# Patient Record
Sex: Male | Born: 1940 | Race: White | Hispanic: No | Marital: Married | Smoking: Never smoker
Health system: Southern US, Community
[De-identification: ages and names within clinical notes are randomized; demographics above are authoritative.]

## PROBLEM LIST (undated history)

## (undated) DIAGNOSIS — I2510 Atherosclerotic heart disease of native coronary artery without angina pectoris: Secondary | ICD-10-CM

## (undated) DIAGNOSIS — N400 Enlarged prostate without lower urinary tract symptoms: Secondary | ICD-10-CM

## (undated) DIAGNOSIS — N289 Disorder of kidney and ureter, unspecified: Secondary | ICD-10-CM

## (undated) DIAGNOSIS — I10 Essential (primary) hypertension: Secondary | ICD-10-CM

## (undated) DIAGNOSIS — E079 Disorder of thyroid, unspecified: Secondary | ICD-10-CM

## (undated) HISTORY — PX: OTHER SURGICAL HISTORY: SHX169

## (undated) HISTORY — PX: CHOLECYSTECTOMY: SHX55

---

## 2014-07-16 DIAGNOSIS — I2510 Atherosclerotic heart disease of native coronary artery without angina pectoris: Secondary | ICD-10-CM | POA: Insufficient documentation

## 2014-07-16 DIAGNOSIS — E039 Hypothyroidism, unspecified: Secondary | ICD-10-CM | POA: Insufficient documentation

## 2015-01-11 DIAGNOSIS — N401 Enlarged prostate with lower urinary tract symptoms: Secondary | ICD-10-CM | POA: Insufficient documentation

## 2015-06-03 DIAGNOSIS — E785 Hyperlipidemia, unspecified: Secondary | ICD-10-CM | POA: Insufficient documentation

## 2015-12-16 DIAGNOSIS — I679 Cerebrovascular disease, unspecified: Secondary | ICD-10-CM | POA: Insufficient documentation

## 2017-02-21 ENCOUNTER — Emergency Department: Payer: Medicare Other

## 2017-02-21 ENCOUNTER — Emergency Department
Admission: EM | Admit: 2017-02-21 | Discharge: 2017-02-21 | Disposition: A | Discharge status: Home / Self Care | Payer: Medicare Other | Attending: Emergency Medicine | Admitting: Emergency Medicine

## 2017-02-21 ENCOUNTER — Other Ambulatory Visit: Payer: Self-pay

## 2017-02-21 ENCOUNTER — Encounter: Payer: Self-pay | Admitting: Emergency Medicine

## 2017-02-21 DIAGNOSIS — R319 Hematuria, unspecified: Secondary | ICD-10-CM | POA: Diagnosis present

## 2017-02-21 DIAGNOSIS — N390 Urinary tract infection, site not specified: Secondary | ICD-10-CM | POA: Insufficient documentation

## 2017-02-21 DIAGNOSIS — I10 Essential (primary) hypertension: Secondary | ICD-10-CM | POA: Diagnosis not present

## 2017-02-21 DIAGNOSIS — I2510 Atherosclerotic heart disease of native coronary artery without angina pectoris: Secondary | ICD-10-CM | POA: Diagnosis not present

## 2017-02-21 HISTORY — DX: Disorder of kidney and ureter, unspecified: N28.9

## 2017-02-21 HISTORY — DX: Essential (primary) hypertension: I10

## 2017-02-21 HISTORY — DX: Disorder of thyroid, unspecified: E07.9

## 2017-02-21 HISTORY — DX: Benign prostatic hyperplasia without lower urinary tract symptoms: N40.0

## 2017-02-21 HISTORY — DX: Atherosclerotic heart disease of native coronary artery without angina pectoris: I25.10

## 2017-02-21 LAB — CBC WITH DIFFERENTIAL/PLATELET
Basophils Absolute: 0 10*3/uL (ref 0–0.1)
Basophils Relative: 0 %
EOS ABS: 0.2 10*3/uL (ref 0–0.7)
Eosinophils Relative: 2 %
HCT: 39.5 % — ABNORMAL LOW (ref 40.0–52.0)
HEMOGLOBIN: 13.6 g/dL (ref 13.0–18.0)
LYMPHS ABS: 0.7 10*3/uL — AB (ref 1.0–3.6)
LYMPHS PCT: 7 %
MCH: 33.7 pg (ref 26.0–34.0)
MCHC: 34.3 g/dL (ref 32.0–36.0)
MCV: 98.4 fL (ref 80.0–100.0)
Monocytes Absolute: 0.9 10*3/uL (ref 0.2–1.0)
Monocytes Relative: 9 %
NEUTROS ABS: 8.2 10*3/uL — AB (ref 1.4–6.5)
NEUTROS PCT: 82 %
Platelets: 211 10*3/uL (ref 150–440)
RBC: 4.02 MIL/uL — AB (ref 4.40–5.90)
RDW: 14.1 % (ref 11.5–14.5)
WBC: 10.1 10*3/uL (ref 3.8–10.6)

## 2017-02-21 LAB — URINALYSIS, COMPLETE (UACMP) WITH MICROSCOPIC
Bilirubin Urine: NEGATIVE
Glucose, UA: NEGATIVE mg/dL
Ketones, ur: NEGATIVE mg/dL
NITRITE: POSITIVE — AB
PH: 5 (ref 5.0–8.0)
Protein, ur: 30 mg/dL — AB
SPECIFIC GRAVITY, URINE: 1.018 (ref 1.005–1.030)
Squamous Epithelial / HPF: NONE SEEN

## 2017-02-21 LAB — BASIC METABOLIC PANEL
Anion gap: 7 (ref 5–15)
BUN: 23 mg/dL — AB (ref 6–20)
CHLORIDE: 103 mmol/L (ref 101–111)
CO2: 27 mmol/L (ref 22–32)
Calcium: 9.1 mg/dL (ref 8.9–10.3)
Creatinine, Ser: 1.3 mg/dL — ABNORMAL HIGH (ref 0.61–1.24)
GFR calc Af Amer: 59 mL/min — ABNORMAL LOW (ref >60)
GFR calc non Af Amer: 51 mL/min — ABNORMAL LOW (ref >60)
Glucose, Bld: 105 mg/dL — ABNORMAL HIGH (ref 65–99)
POTASSIUM: 3.9 mmol/L (ref 3.5–5.1)
SODIUM: 137 mmol/L (ref 135–145)

## 2017-02-21 MED ORDER — CEPHALEXIN 500 MG PO CAPS: 500.0000 mg | ORAL_CAPSULE | Freq: Three times a day (TID) | ORAL | 0 refills | Status: AC

## 2017-02-21 MED ORDER — CEPHALEXIN 500 MG PO CAPS
500.0000 mg | ORAL_CAPSULE | Freq: Once | ORAL | Status: AC
  Administered 2017-02-21: 500 mg via ORAL
  Filled 2017-02-21: qty 1

## 2017-02-21 NOTE — ED Triage Notes (Signed)
Pt presents to ED c/o back pain , intermittent bladder pain , and dysuria for a few days. Pt woke up this morning with blood in his urine, denies pain.

## 2017-02-21 NOTE — ED Provider Notes (Signed)
[facility] Emergency Department Provider Note  ____________________________________________   First MD Initiated Contact with Patient 02/21/17 1414     (approximate)  I have reviewed the triage vital signs and the nursing notes.   HISTORY  Chief Complaint Hematuria; Back Pain; and Nephrolithiasis    HPI Douglas Hale is a 76 y.o. male with a history of coronary artery disease as well as multiple kidney stones is present to the emergency department today with hematuria. He says that 3 days ago he was having diffuse body aches, low-grade temperature and back pain. However, he says this stopped all of a sudden and then last night he started having hematuria. He says that he has had 3 episodes of hematuria since 12 AM. Says he is very mild low back pain now which is consistent with his chronic low back pain but less than what he has usually. He says the back pain is the low lumbar region in the midline and also little bit to the right. He denies any radiation to the lower extremities.Also with urinary frequency recently.   Past Medical History:  Diagnosis Date  . Coronary artery disease   . Hypertension   . Prostate enlargement   . Renal disorder   . Thyroid disease     There are no active problems to display for this patient.   Past Surgical History:  Procedure Laterality Date  . CHOLECYSTECTOMY    . lithotripsy      Prior to Admission medications   Not on File    Allergies Hydrocodone and Sulfa antibiotics  History reviewed. No pertinent family history.  Social History Social History  Substance Use Topics  . Smoking status: Never Smoker  . Smokeless tobacco: Not on file  . Alcohol use No    Review of Systems Constitutional: No fever/chills Eyes: No visual changes. ENT: No sore throat. Cardiovascular: Denies chest pain. Respiratory: Denies shortness of breath. Gastrointestinal: No abdominal pain.  No nausea, no vomiting.  No  diarrhea.  No constipation. Genitourinary: as above Musculoskeletal:as above. Skin: Negative for rash. Neurological: Negative for headaches, focal weakness or numbness.  10-point ROS otherwise negative.  ____________________________________________   PHYSICAL EXAM:  VITAL SIGNS: ED Triage Vitals  Enc Vitals Group     BP 02/21/17 1125 92/73     Pulse Rate 02/21/17 1125 (!) 114     Resp 02/21/17 1125 19     Temp 02/21/17 1125 98.4 F (36.9 C)     Temp Source 02/21/17 1125 Oral     SpO2 02/21/17 1125 96 %     Weight 02/21/17 1128 195 lb (88.5 kg)     Height 02/21/17 1128  (1.753 m)     Head Circumference --      Peak Flow --      Pain Score --      Pain Loc --      Pain Edu? --      Excl. in GC? --     Constitutional: Alert and oriented. Well appearing and in no acute distress. Eyes: Conjunctivae are normal. PERRL. EOMI. Head: Atraumatic. Nose: No congestion/rhinnorhea. Mouth/Throat: Mucous membranes are moist.   Neck: No stridor.   Cardiovascular: Normal rate, regular rhythm. Grossly normal heart sounds.   Respiratory: Normal respiratory effort.  No retractions. Lungs CTAB. Gastrointestinal: Soft and nontender. No distention.  Musculoskeletal: No lower extremity tenderness nor edema.  No joint effusions.No tenderness over the lumbar region throughout. No deformity or step-off. Neurologic:  Normal speech  and language. No gross focal neurologic deficits are appreciated.  Skin:  Skin is warm, dry and intact. No rash noted. Psychiatric: Mood and affect are normal. Speech and behavior are normal.  ____________________________________________   LABS (all labs ordered are listed, but only abnormal results are displayed)  Labs Reviewed  URINALYSIS, COMPLETE (UACMP) WITH MICROSCOPIC - Abnormal; Notable for the following:       Result Value   Color, Urine AMBER (*)    APPearance CLOUDY (*)    Hgb urine dipstick LARGE (*)    Protein, ur 30 (*)    Nitrite POSITIVE  (*)    Leukocytes, UA LARGE (*)    Bacteria, UA MANY (*)    All other components within normal limits  CBC WITH DIFFERENTIAL/PLATELET - Abnormal; Notable for the following:    RBC 4.02 (*)    HCT 39.5 (*)    Neutro Abs 8.2 (*)    Lymphs Abs 0.7 (*)    All other components within normal limits  BASIC METABOLIC PANEL - Abnormal; Notable for the following:    Glucose, Bld 105 (*)    BUN 23 (*)    Creatinine, Ser 1.30 (*)    GFR calc non Af Amer 51 (*)    GFR calc Af Amer 59 (*)    All other components within normal limits  URINE CULTURE   ____________________________________________  EKG   ____________________________________________  RADIOLOGY    US Renal (Final result)  Result time 02/21/17 16:09:18  Final result by Elberta Fortis, MD (02/21/17 16:09:18)           Narrative:   CLINICAL DATA: Low back pain 3 days. Gross hematuria. History of kidney stones.  EXAM: RENAL / URINARY TRACT ULTRASOUND COMPLETE  COMPARISON: None.  FINDINGS: Right Kidney:  Length: 12.2 cm. Several shadowing echogenic foci compatible with stones with the largest measuring 9 mm over the lower pole. 2.2 cm cyst over the mid pole. No hydronephrosis. Echogenicity within normal.  Left Kidney:  Length: 10.9 cm. Echogenicity within normal limits. No mass or hydronephrosis visualized. Few tiny shadowing echogenic foci likely stones with the largest measuring 4 mm over the mid pole.  Bladder:  Appears normal for degree of bladder distention. Bilateral ureteral jets visualized.  Mild prominence of the prostate gland.  IMPRESSION: Normal size kidneys. Findings suggesting mild bilateral nephrolithiasis. No hydronephrosis.  Mild prostatic enlargement.   Electronically Signed By: Elberta Fortis M.D. On: 02/21/2017 16:09            ____________________________________________   PROCEDURES  Procedure(s) performed:   Procedures  Critical Care performed:    ____________________________________________   INITIAL IMPRESSION / ASSESSMENT AND PLAN / ED COURSE  Pertinent labs & imaging results that were available during my care of the patient were reviewed by me and considered in my medical decision making (see chart for details).  ----------------------------------------- 4:46 PM on 02/21/2017 -----------------------------------------  Patient resting comfortably at this time. Reassuring workup. Renal insufficiency. Unsure if this is new or chronic. No recent kidney functions on record. Patient will be discharged with Keflex. Explained the diagnosis was. The patient and family. He has a primary care doctor and he'll be following up there within 1 week.      ____________________________________________   FINAL CLINICAL IMPRESSION(S) / ED DIAGNOSES  UTI.    NEW MEDICATIONS STARTED DURING THIS VISIT:  New Prescriptions   No medications on file     Note:  This document was prepared using Dragon voice recognition software and  may include unintentional dictation errors.    Myrna Blazer, MD 02/21/17 [phone]

## 2017-02-23 LAB — URINE CULTURE

## 2017-02-26 ENCOUNTER — Emergency Department
Admission: EM | Admit: 2017-02-26 | Discharge: 2017-02-26 | Disposition: A | Discharge status: Home / Self Care | Payer: Medicare Other | Attending: Emergency Medicine | Admitting: Emergency Medicine

## 2017-02-26 ENCOUNTER — Emergency Department: Payer: Medicare Other

## 2017-02-26 ENCOUNTER — Encounter: Payer: Self-pay | Admitting: Emergency Medicine

## 2017-02-26 DIAGNOSIS — I10 Essential (primary) hypertension: Secondary | ICD-10-CM | POA: Insufficient documentation

## 2017-02-26 DIAGNOSIS — Y999 Unspecified external cause status: Secondary | ICD-10-CM | POA: Diagnosis not present

## 2017-02-26 DIAGNOSIS — Y929 Unspecified place or not applicable: Secondary | ICD-10-CM | POA: Diagnosis not present

## 2017-02-26 DIAGNOSIS — G8929 Other chronic pain: Secondary | ICD-10-CM

## 2017-02-26 DIAGNOSIS — W010XXA Fall on same level from slipping, tripping and stumbling without subsequent striking against object, initial encounter: Secondary | ICD-10-CM | POA: Diagnosis not present

## 2017-02-26 DIAGNOSIS — S3992XA Unspecified injury of lower back, initial encounter: Secondary | ICD-10-CM | POA: Diagnosis present

## 2017-02-26 DIAGNOSIS — I2510 Atherosclerotic heart disease of native coronary artery without angina pectoris: Secondary | ICD-10-CM | POA: Diagnosis not present

## 2017-02-26 DIAGNOSIS — Y939 Activity, unspecified: Secondary | ICD-10-CM | POA: Insufficient documentation

## 2017-02-26 DIAGNOSIS — S20222A Contusion of left back wall of thorax, initial encounter: Secondary | ICD-10-CM

## 2017-02-26 DIAGNOSIS — M545 Low back pain: Secondary | ICD-10-CM

## 2017-02-26 DIAGNOSIS — S300XXA Contusion of lower back and pelvis, initial encounter: Secondary | ICD-10-CM | POA: Diagnosis not present

## 2017-02-26 LAB — URINALYSIS, COMPLETE (UACMP) WITH MICROSCOPIC
Bacteria, UA: NONE SEEN
Bilirubin Urine: NEGATIVE
GLUCOSE, UA: NEGATIVE mg/dL
Hgb urine dipstick: NEGATIVE
KETONES UR: NEGATIVE mg/dL
Leukocytes, UA: NEGATIVE
Nitrite: NEGATIVE
PROTEIN: NEGATIVE mg/dL
Specific Gravity, Urine: 1.018 (ref 1.005–1.030)
pH: 6 (ref 5.0–8.0)

## 2017-02-26 MED ORDER — CARISOPRODOL 350 MG PO TABS: 350.0000 mg | ORAL_TABLET | Freq: Three times a day (TID) | ORAL | 0 refills | Status: DC

## 2017-02-26 MED ORDER — OXYCODONE-ACETAMINOPHEN 5-325 MG PO TABS
1.0000 | ORAL_TABLET | Freq: Once | ORAL | Status: AC
  Administered 2017-02-26: 1 via ORAL
  Filled 2017-02-26: qty 1

## 2017-02-26 MED ORDER — OXYCODONE-ACETAMINOPHEN 5-325 MG PO TABS: 1.0000 | ORAL_TABLET | Freq: Three times a day (TID) | ORAL | 0 refills | Status: DC | PRN

## 2017-02-26 NOTE — ED Triage Notes (Signed)
Pt to ed with c/o fall last night.  Pt states he was getting into the bed and fell backwards into nightstand. Pt now c/o back pain.

## 2017-02-26 NOTE — Discharge Instructions (Signed)
Your exam, labs, and x-ray are essentially normal today. There is no evidence of an acute (new) fracture to the spine. Take the prescription meds as directed. Follow-up with your provider as needed. Return to the ED for acutely worsening symptoms.

## 2017-02-26 NOTE — ED Provider Notes (Signed)
[facility] Emergency Department Provider Note ____________________________________________  Time seen: 1249  I have reviewed the triage vital signs and the nursing notes.  HISTORY  Chief Complaint  Fall  HPI Douglas Hale is a 76 y.o. male presents to the ED accompanied by his wife, for evaluation of back pain following a fall last night. He describes somehow slipping while leaning against the bed. He fell against the nightstand. He reports pain to the left thoracolumbar region. He has a hs He denies hematuria, laceration, sprain, head injury or LOC. He was seen in the ED 5 days prior for flank pain and suspected kidney stones.  Past Medical History:  Diagnosis Date  . Coronary artery disease   . Hypertension   . Prostate enlargement   . Renal disorder   . Thyroid disease     There are no active problems to display for this patient.   Past Surgical History:  Procedure Laterality Date  . CHOLECYSTECTOMY    . lithotripsy      Prior to Admission medications   Medication Sig Start Date End Date Taking? Authorizing Provider  carisoprodol (SOMA) 350 MG tablet Take 1 tablet (350 mg total) by mouth 3 (three) times daily. 02/26/17   Christino Mcglinchey V Bacon Brexton Sofia, PA-C  cephALEXin (KEFLEX) 500 MG capsule Take 1 capsule (500 mg total) by mouth 3 (three) times daily. 02/21/17 03/03/17  Myrna Blazer, MD  oxyCODONE-acetaminophen (ROXICET) 5-325 MG tablet Take 1 tablet by mouth every 8 (eight) hours as needed. 02/26/17   Ruba Outen V Bacon Angelyne Terwilliger, PA-C    Allergies Escitalopram; Hydrocodone; Lexapro [escitalopram oxalate]; and Sulfa antibiotics  History reviewed. No pertinent family history.  Social History Social History  Substance Use Topics  . Smoking status: Never Smoker  . Smokeless tobacco: Never Used  . Alcohol use No    Review of Systems  Constitutional: Negative for fever. Cardiovascular: Negative for chest pain. Respiratory: Negative for  shortness of breath. Gastrointestinal: Negative for abdominal pain, vomiting and diarrhea. Genitourinary: Negative for dysuria. Musculoskeletal: Positive for back pain. ____________________________________________  PHYSICAL EXAM:  VITAL SIGNS: ED Triage Vitals  Enc Vitals Group     BP 02/26/17 1157 117/79     Pulse Rate 02/26/17 1157 (!) 108     Resp 02/26/17 1157 18     Temp 02/26/17 1157 98.4 F (36.9 C)     Temp Source 02/26/17 1157 Oral     SpO2 02/26/17 1157 97 %     Weight 02/26/17 1158 195 lb (88.5 kg)     Height --      Head Circumference --      Peak Flow --      Pain Score 02/26/17 1157 10     Pain Loc --      Pain Edu? --      Excl. in GC? --    Constitutional: Alert and oriented. Well appearing and in no distress. Head: Normocephalic and atraumatic. Eyes: Conjunctivae are normal. PERRL. Normal extraocular movements Cardiovascular: Normal rate, regular rhythm. Normal distal pulses. Respiratory: Normal respiratory effort. No wheezes/rales/rhonchi. Gastrointestinal: Soft and nontender. No distention. Musculoskeletal: Normal spinal alignment without midline tenderness, spasm, deformity, or step-off. Minimally tender to palp over the left thoracolumbar region. No flank tenderness. Nontender with normal range of motion in all extremities.  Neurologic:  Normal gait without ataxia. Normal speech and language. No gross focal neurologic deficits are appreciated. Skin:  Skin is warm, dry and intact. No rash noted. Psychiatric: Mood and affect are  normal. Patient exhibits appropriate insight and judgment. ____________________________________________   RADIOLOGY  Lumbar Spine IMPRESSION: Anterior wedging of the L2 vertebral body which may well be acute. No retropulsion of bone evident by radiography. No other fracture. No spondylolisthesis. There are areas of facet osteoarthritic changes several levels. There is aortoiliac atherosclerosis. There is a calculus in the  right kidney.  Care Everywhere Outside Records - [facility] Exam: XR SPINE LUMBAR2 TO 3 VIEWS 3 views  Indication:BACK PAIN - LUMBAR AREA, M54.5 Low back pain  Comparison: Spine MRI 01/28/2016 1935 hours lumbar spine radiograph 12/20/2013  Date: 01/29/2016 1:09 AM  Findings/Impression: There are 5 nonrib-bearing lumbar type vertebral bodies. There is a mild straightening of the lumbar spine without subluxation. L2 superior endplate compression fracture with approximately 30% central height loss, as seen on MRI, new compared to radiographs from 12/20/2013. Mild wedging of L1 and T12 are unchanged from prior radiograph. There are severe L5-S1 facet joint degenerative changes more mild L4-L5 facet joint degenerative changes. Atherosclerotic calcifications in the aorta.  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 No demonstrable thoracic fracture or spondylolisthesis. ____________________________________________  LABORATORY   Labs Reviewed  URINALYSIS, COMPLETE (UACMP) WITH MICROSCOPIC - Abnormal; Notable for the following:       Result Value   Color, Urine YELLOW (*)    APPearance CLEAR (*)    Squamous Epithelial / LPF 0-5 (*)    All other components within normal limits   _____________________________________________  PROCEDURES  Percocet 5-325 mg PO ____________________________________________  INITIAL IMPRESSION / ASSESSMENT AND PLAN / ED COURSE  Patient with a back contusion and acute flare of chronic LBP. He is reassured by his negative x-rays and UA. His acute-appearing L2 compression fracture has been present since March 2017. He is discharged with a prescription for Percocet #10 and Robaxin. He will follow-up with his spinal specialist for ongoing management. Return to the ED as needed.   ____________________________________________  FINAL CLINICAL IMPRESSION(S) / ED DIAGNOSES  Final diagnoses:  Back contusion, left, initial encounter  Acute exacerbation of chronic low back pain      Lissa Hoard, PA-C 02/27/17 1903    Myrna Blazer, MD 02/28/17 [phone]

## 2017-03-01 ENCOUNTER — Encounter: Payer: Self-pay | Admitting: *Deleted

## 2017-03-01 ENCOUNTER — Emergency Department
Admission: EM | Admit: 2017-03-01 | Discharge: 2017-03-01 | Disposition: A | Discharge status: Home / Self Care | Payer: Medicare Other | Attending: Student in an Organized Health Care Education/Training Program | Admitting: Student in an Organized Health Care Education/Training Program

## 2017-03-01 ENCOUNTER — Emergency Department: Payer: Medicare Other

## 2017-03-01 DIAGNOSIS — S32020D Wedge compression fracture of second lumbar vertebra, subsequent encounter for fracture with routine healing: Secondary | ICD-10-CM | POA: Insufficient documentation

## 2017-03-01 DIAGNOSIS — Y929 Unspecified place or not applicable: Secondary | ICD-10-CM | POA: Insufficient documentation

## 2017-03-01 DIAGNOSIS — I2510 Atherosclerotic heart disease of native coronary artery without angina pectoris: Secondary | ICD-10-CM | POA: Diagnosis not present

## 2017-03-01 DIAGNOSIS — Y999 Unspecified external cause status: Secondary | ICD-10-CM | POA: Insufficient documentation

## 2017-03-01 DIAGNOSIS — I10 Essential (primary) hypertension: Secondary | ICD-10-CM | POA: Insufficient documentation

## 2017-03-01 DIAGNOSIS — Y939 Activity, unspecified: Secondary | ICD-10-CM | POA: Diagnosis not present

## 2017-03-01 DIAGNOSIS — S299XXA Unspecified injury of thorax, initial encounter: Secondary | ICD-10-CM | POA: Diagnosis present

## 2017-03-01 DIAGNOSIS — Z79899 Other long term (current) drug therapy: Secondary | ICD-10-CM | POA: Diagnosis not present

## 2017-03-01 DIAGNOSIS — S2242XA Multiple fractures of ribs, left side, initial encounter for closed fracture: Secondary | ICD-10-CM | POA: Insufficient documentation

## 2017-03-01 DIAGNOSIS — R109 Unspecified abdominal pain: Secondary | ICD-10-CM | POA: Diagnosis not present

## 2017-03-01 DIAGNOSIS — W1839XA Other fall on same level, initial encounter: Secondary | ICD-10-CM | POA: Insufficient documentation

## 2017-03-01 MED ORDER — OXYCODONE-ACETAMINOPHEN 7.5-325 MG PO TABS: 1.0000 | ORAL_TABLET | Freq: Three times a day (TID) | ORAL | 0 refills | Status: AC | PRN

## 2017-03-01 MED ORDER — LIDOCAINE 5 % EX PTCH
1.0000 | MEDICATED_PATCH | CUTANEOUS | Status: DC
  Administered 2017-03-01: 1 via TRANSDERMAL
  Filled 2017-03-01 (×2): qty 1

## 2017-03-01 MED ORDER — LIDOCAINE 5 % EX PTCH: 1.0000 | MEDICATED_PATCH | Freq: Two times a day (BID) | CUTANEOUS | 0 refills | Status: AC

## 2017-03-01 MED ORDER — DICLOFENAC SODIUM 1 % TD GEL: 2.0000 g | Freq: Four times a day (QID) | TRANSDERMAL | 0 refills | Status: DC

## 2017-03-01 MED ORDER — BACLOFEN 10 MG PO TABS: 10.0000 mg | ORAL_TABLET | Freq: Every day | ORAL | 0 refills | Status: AC

## 2017-03-01 NOTE — Care Management Note (Signed)
Case Management Note  Patient Details  Name: Douglas Hale MRN: [id number] Date of Birth: [birth year]  Subjective/Objective:       MD has asked for referral for home PT and the spouse asked for [facility]. [facility] has accepted the referral per Feliberto Gottron by phone, and the patient and wife were  Made aware . The MD is finishing his note, and the face to face is in EPIC.  PCP for the patient is at [facility] and was last seen 3 weeks ago, per the patient.      Action/Plan:   Expected Discharge Date:                  Expected Discharge Plan:     In-House Referral:     Discharge planning Services     Post Acute Care Choice:    Choice offered to:     DME Arranged:    DME Agency:     HH Arranged:    HH Agency:     Status of Service:     If discussed at [employer] of Stay Meetings, dates discussed:    Additional Comments:  Berna Bue, RN 03/01/2017, 2:32 PM

## 2017-03-01 NOTE — ED Provider Notes (Signed)
[facility] Emergency Department Provider Note    First MD Initiated Contact with Patient 03/01/17 1312     (approximate)  I have reviewed the triage vital signs and the nursing notes.   HISTORY  Chief Complaint Back Pain    HPI Douglas Hale is a 76 y.o. male with recent fall presents with worsening left flank pain and back pain.He was discharged with pain medication and muscle relaxants but has had worsening pain throughout the week. No cough or shortness of breath.  Pain is worsened with movement. Currently rates it as a 10 out of 10 in severity when he is moving. When he is not moving he doesn't have any severe pain.    Past Medical History:  Diagnosis Date  . Coronary artery disease   . Hypertension   . Prostate enlargement   . Renal disorder   . Thyroid disease    History reviewed. No pertinent family history. Past Surgical History:  Procedure Laterality Date  . CHOLECYSTECTOMY    . lithotripsy     There are no active problems to display for this patient.     Prior to Admission medications   Medication Sig Start Date End Date Taking? Authorizing Provider  carisoprodol (SOMA) 350 MG tablet Take 1 tablet (350 mg total) by mouth 3 (three) times daily. 02/26/17   Jenise V Bacon Menshew, PA-C  cephALEXin (KEFLEX) 500 MG capsule Take 1 capsule (500 mg total) by mouth 3 (three) times daily. 02/21/17 03/03/17  Myrna Blazer, MD  oxyCODONE-acetaminophen (ROXICET) 5-325 MG tablet Take 1 tablet by mouth every 8 (eight) hours as needed. 02/26/17   Jenise V Bacon Menshew, PA-C    Allergies Escitalopram; Hydrocodone; Lexapro [escitalopram oxalate]; and Sulfa antibiotics    Social History Social History  Substance Use Topics  . Smoking status: Never Smoker  . Smokeless tobacco: Never Used  . Alcohol use No    Review of Systems Patient denies headaches, rhinorrhea, blurry vision, numbness, shortness of breath, chest pain, edema, cough,  abdominal pain, nausea, vomiting, diarrhea, dysuria, fevers, rashes or hallucinations unless otherwise stated above in HPI. ____________________________________________   PHYSICAL EXAM:  VITAL SIGNS: Vitals:   03/01/17 1158  BP: 100/62  Pulse: 98  Resp: 18  Temp: 97.7 F (36.5 C)    Constitutional: Alert and oriented. Well appearing and in no acute distress. Eyes: Conjunctivae are normal. PERRL. EOMI. Head: Atraumatic. Nose: No congestion/rhinnorhea. Mouth/Throat: Mucous membranes are moist.  Oropharynx non-erythematous. Neck: No stridor. Painless ROM. No cervical spine tenderness to palpation Hematological/Lymphatic/Immunilogical: No cervical lymphadenopathy. Cardiovascular: Normal rate, regular rhythm. Grossly normal heart sounds.  Good peripheral circulation. Respiratory: Normal respiratory effort.  No retractions. Lungs CTAB. Gastrointestinal: Soft and nontender. No distention. No abdominal bruits. No CVA tenderness. Musculoskeletal: No lower extremity tenderness nor edema. Ecchymosis and ttp of left lateral inferior ribs, no midline ttp, no step offs or deformities Neurologic:  Normal speech and language. No gross focal neurologic deficits are appreciated. No gait instability. Skin:  Skin is warm, dry and intact. No rash noted. Psychiatric: Mood and affect are normal. Speech and behavior are normal.  ____________________________________________   LABS (all labs ordered are listed, but only abnormal results are displayed)  No results found for this or any previous visit (from the past 24 hour(s)). ____________________________________________  EKG____________________________________________  RADIOLOGY  I personally reviewed all radiographic images ordered to evaluate for the above acute complaints and reviewed radiology reports and findings.  These findings were personally discussed with  the patient.  Please see medical record for radiology  report.  ____________________________________________   PROCEDURES  Procedure(s) performed:  Procedures    Critical Care performed: no ____________________________________________   INITIAL IMPRESSION / ASSESSMENT AND PLAN / ED COURSE  Pertinent labs & imaging results that were available during my care of the patient were reviewed by me and considered in my medical decision making (see chart for details).  DDX: rib fracture, contusion, spasm  Douglas Hale is a 76 y.o. who presents to the ED with left flank pain and back pain after recent fall. Patient is AFVSS in ED. Exam as above. Given current presentation have considered the above differential. Recent x-rays that show evidence of mild compression fracture. He has no other associated neurodeficits. Based on his persistent pain and concern for hematoma versus rib fracture versus hemothorax CT imaging ordered to further characterize.   Clinical Course as of Mar 01 1426  Mon Mar 01, 2017  1422 Patient with evidence of 2 left-sided rib fractures without evidence of hemothorax or pneumothorax. Also with evidence of acute L2 wedge compression without any evidence of retropulsion. Proving with Lidoderm patch. I have spoken with case management to arrange home health PT. Patient will be provided with additional medications for his acute injuries. No evidence of pneumonia. Patient is otherwise hemodynamically stable and appropriate for discharge home.  [PR]    Clinical Course User Index [PR] Willy Eddy, MD     ____________________________________________   FINAL CLINICAL IMPRESSION(S) / ED DIAGNOSES  Final diagnoses:  Closed fracture of multiple ribs of left side, initial encounter  Acute left flank pain  Closed compression fracture of L2 lumbar vertebra with routine healing, subsequent encounter      NEW MEDICATIONS STARTED DURING THIS VISIT:  New Prescriptions   No medications on file     Note:  This document  was prepared using Dragon voice recognition software and may include unintentional dictation errors.    Willy Eddy, MD 03/01/17 1434

## 2017-03-01 NOTE — ED Triage Notes (Signed)
Pt states he fell last week, was seen in ED and given pain meds, states continued back pain with no relief from meds

## 2018-02-28 ENCOUNTER — Other Ambulatory Visit: Payer: Self-pay | Admitting: Student

## 2018-02-28 DIAGNOSIS — M545 Low back pain: Principal | ICD-10-CM

## 2018-02-28 DIAGNOSIS — G8929 Other chronic pain: Secondary | ICD-10-CM

## 2018-03-04 ENCOUNTER — Ambulatory Visit
Admission: RE | Admit: 2018-03-04 | Discharge: 2018-03-04 | Disposition: A | Discharge status: Home / Self Care | Payer: Medicare Other | Source: Ambulatory Visit | Attending: Student | Admitting: Student

## 2018-03-04 DIAGNOSIS — M8938 Hypertrophy of bone, other site: Secondary | ICD-10-CM | POA: Diagnosis not present

## 2018-03-04 DIAGNOSIS — M4856XA Collapsed vertebra, not elsewhere classified, lumbar region, initial encounter for fracture: Secondary | ICD-10-CM | POA: Diagnosis not present

## 2018-03-04 DIAGNOSIS — G8929 Other chronic pain: Secondary | ICD-10-CM

## 2018-03-04 DIAGNOSIS — M5126 Other intervertebral disc displacement, lumbar region: Secondary | ICD-10-CM | POA: Diagnosis not present

## 2018-03-04 DIAGNOSIS — M545 Low back pain: Secondary | ICD-10-CM | POA: Diagnosis present

## 2018-03-04 DIAGNOSIS — M4316 Spondylolisthesis, lumbar region: Secondary | ICD-10-CM | POA: Insufficient documentation

## 2018-03-04 DIAGNOSIS — M48061 Spinal stenosis, lumbar region without neurogenic claudication: Secondary | ICD-10-CM | POA: Diagnosis not present

## 2018-07-17 IMAGING — MR MR LUMBAR SPINE W/O CM
5 series · 31 of 48 positions shown · non-contrast
Comparison: None available.

CLINICAL DATA: Initial evaluation for chronic low back pain
radiating into both lower extremities, right greater than left.

EXAM:
MRI LUMBAR SPINE WITHOUT CONTRAST
TECHNIQUE: Multiplanar, multisequence MR imaging of the lumbar spine was
performed. No intravenous contrast was administered.

[Series 3: T2 · sagittal · 4.0mm · 0.81mm/px · 6 of 17 slices shown (1 of 2)]
[im 1/17]
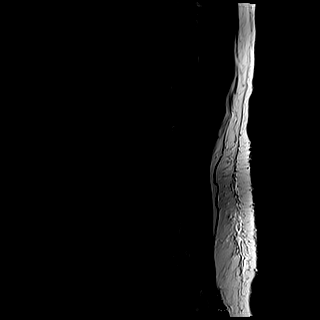
[im 4/17]
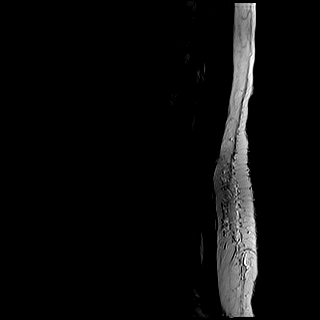
[im 7/17]
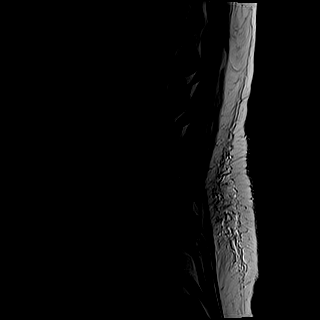
[im 10/17]
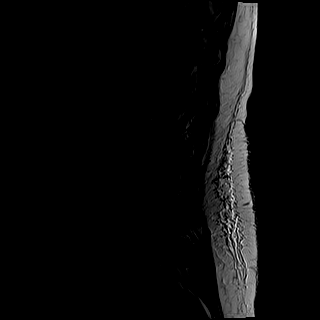
[im 13/17]
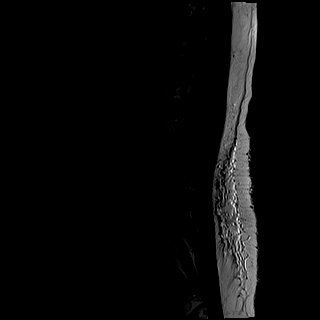
[im 17/17]
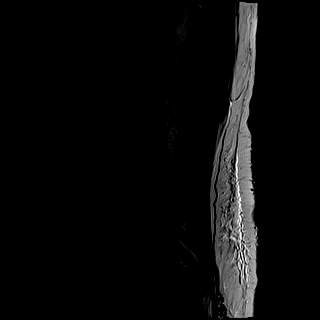

[Series 4: T1 · sagittal · 4.0mm · 0.81mm/px · 6 of 17 slices shown (1 of 2)]
[im 1/17]
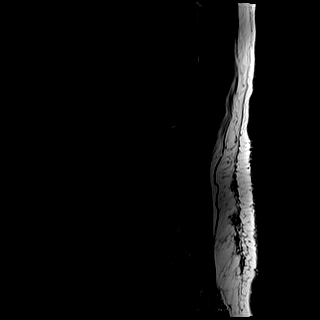
[im 4/17]
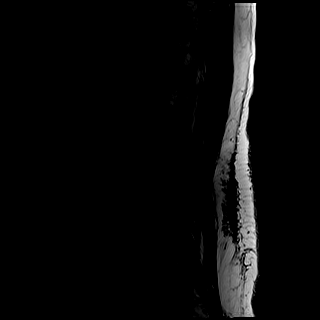
[im 7/17]
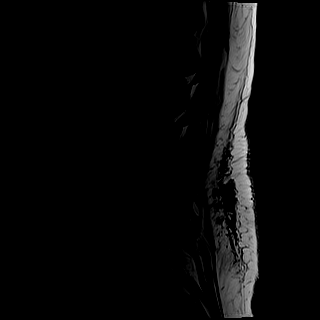
[im 10/17]
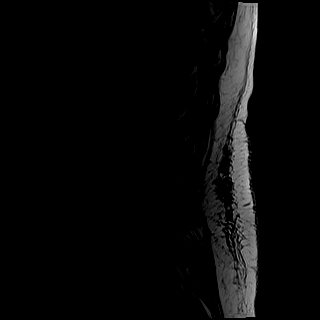
[im 13/17]
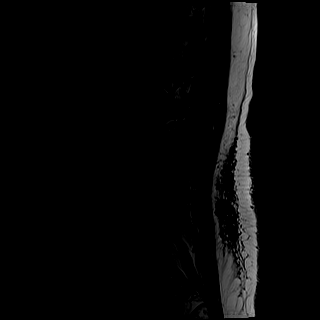
[im 17/17]
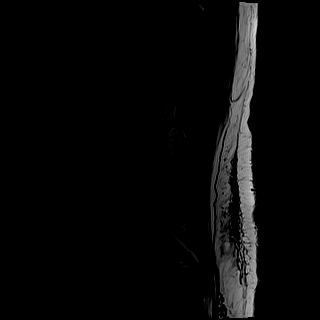

[Series 5: STIR · sagittal · 4.0mm · 0.81mm/px · 1 of 17 slices shown]
[im 1/17]
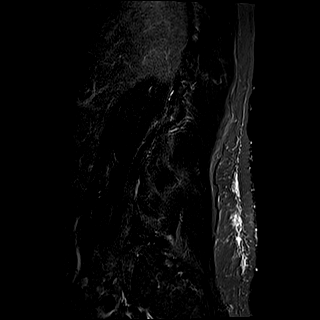

[Series 6: T2 · axial · 4.0mm · 0.78mm/px · z∈[+36,+261]mm · 9 of 42 slices shown (2 of 2)]
[im 1/42]
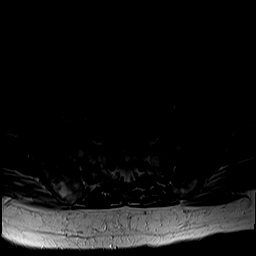
[im 6/42]
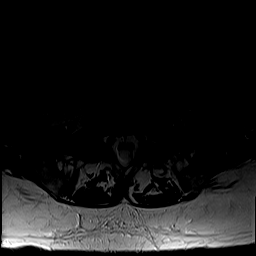
[im 12/42]
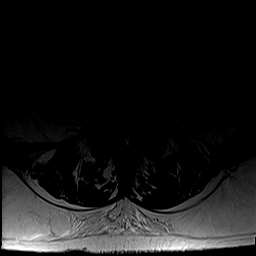
[im 18/42]
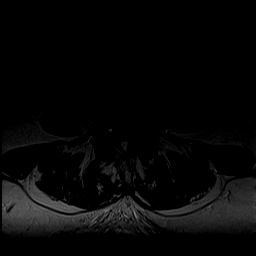
[im 21/42]
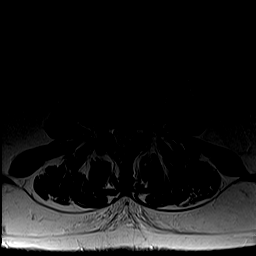
[im 24/42]
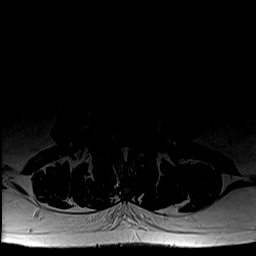
[im 30/42]
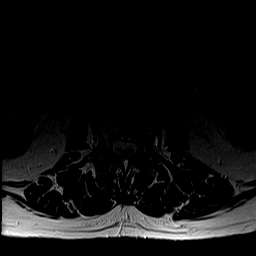
[im 36/42]
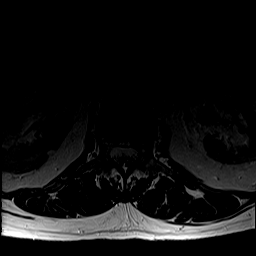
[im 42/42]
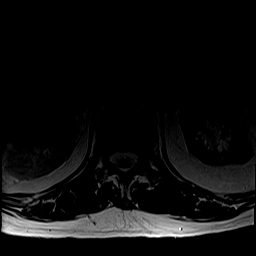

[Series 7: T1 · axial · 4.0mm · 0.39mm/px · z∈[+36,+261]mm · 9 of 42 slices shown (2 of 2)]
[im 1/42]
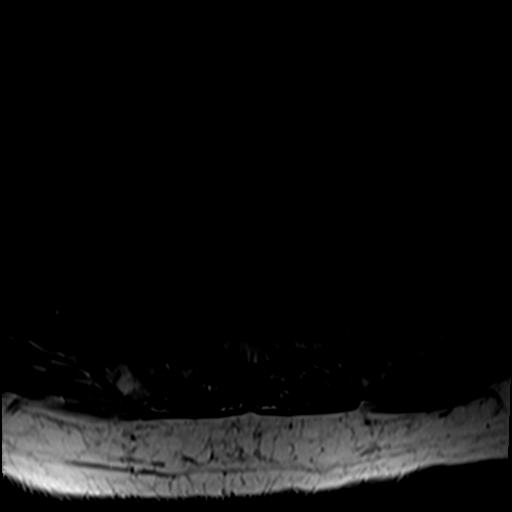
[im 6/42]
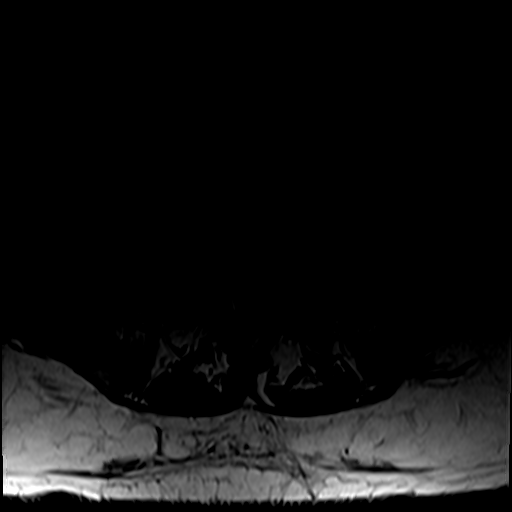
[im 12/42]
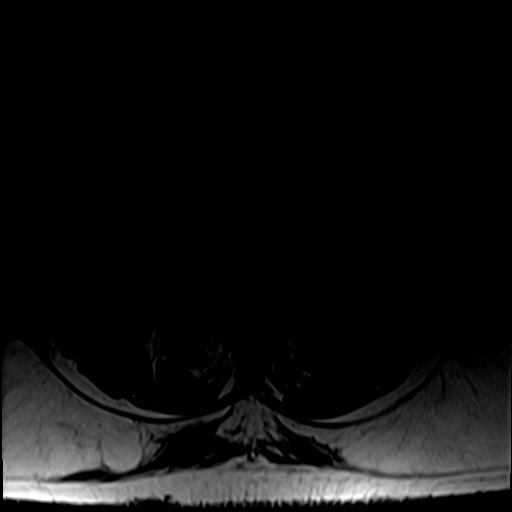
[im 18/42]
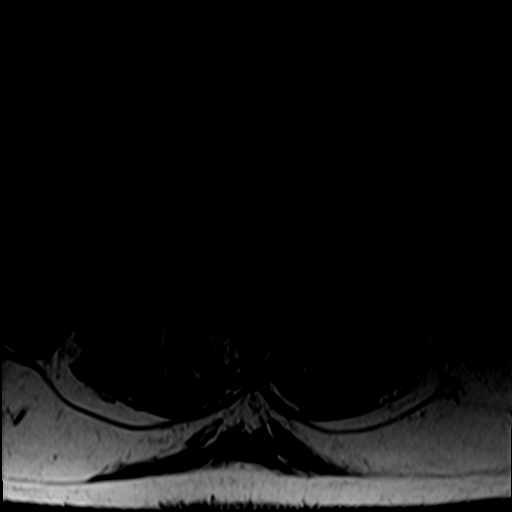
[im 21/42]
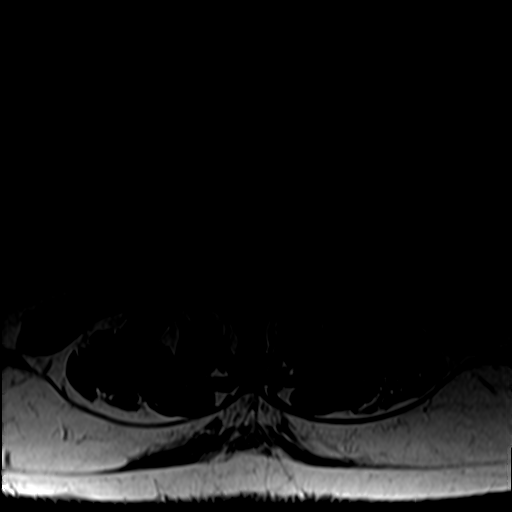
[im 24/42]
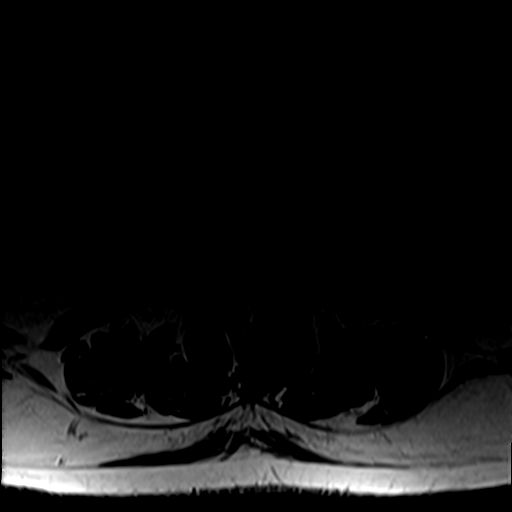
[im 30/42]
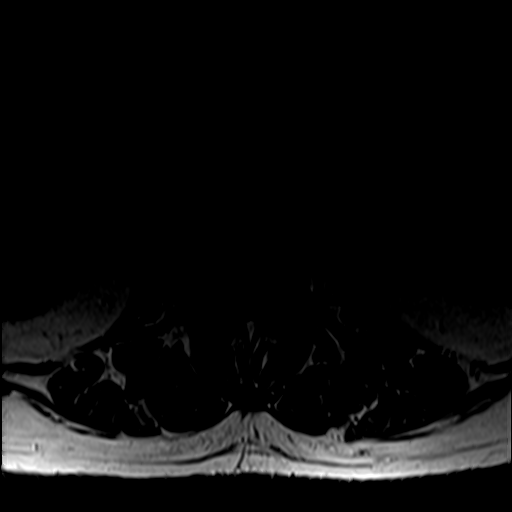
[im 36/42]
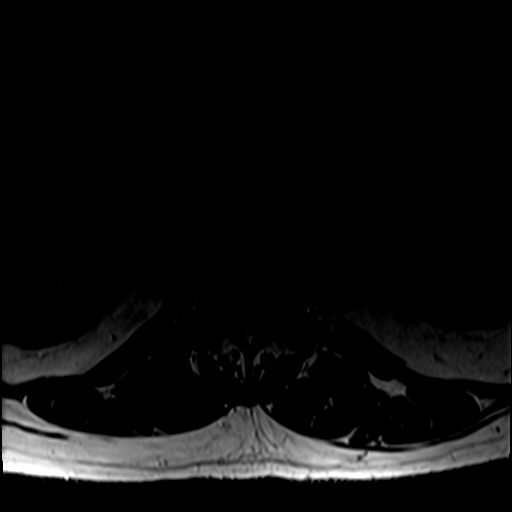
[im 42/42]
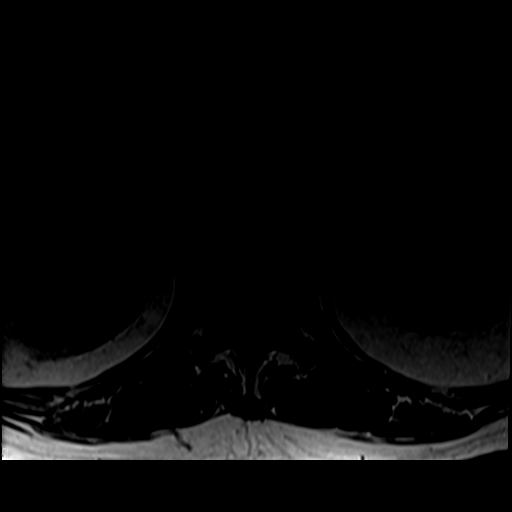

[31 of 48 positions shown; findings below may reference images not displayed]

FINDINGS: Segmentation: Normal segmentation. Lowest well-formed disc labeled
the L5-S1 level.

Alignment: 3 mm anterolisthesis of L4 on L5. Trace 2 mm
anterolisthesis of L5 on S1. 2 mm retrolisthesis of L2 on L3. Mild
rotoscoliosis. Vertebral bodies otherwise normally aligned with
preservation of the normal lumbar lordosis.

Vertebrae: Chronic compression deformity involving the L2 vertebral
body with approximately 50% height loss without significant bony
retropulsion. Degenerative endplate Schmorl's node with mild height
loss at the superior endplate of T12 noted as well. Vertebral body
heights otherwise maintained without evidence for acute fracture.
Bone marrow signal intensity diffusely heterogeneous without
worrisome osseous lesion. Few scattered benign hemangiomas noted.

Conus medullaris and cauda equina: Conus extends to the L1 level.
Conus and cauda equina appear normal.

Paraspinal and other soft tissues: Paraspinous soft tissues within
normal limits. Visualized visceral structures are normal.

Disc levels:

T12-L1: Minimal disc bulge.  No stenosis.

L1-2: Diffuse disc bulge with disc desiccation. Mild facet and
ligament flavum hypertrophy. Mild left lateral recess narrowing
without significant canal stenosis. Foramina remain patent.

L2-3: Trace retrolisthesis. Diffuse disc bulge with disc
desiccation. There is a superimposed broad left
foraminal/extraforaminal disc protrusion extending into the left
L2-3 neural foramen (series 4, image 13). Superimposed mild to
moderate facet hypertrophy. Resultant moderate left subarticular and
foraminal stenosis, potentially affecting either the left L2 or L3
nerve roots. Central canal remains patent. Mild right lateral recess
and foraminal narrowing noted as well.

L3-4: Mild diffuse disc bulge. Moderate facet and ligament flavum
hypertrophy. Resultant mild to moderate left with mild right lateral
recess narrowing with very mild central canal stenosis. Mild
bilateral L3 foraminal narrowing.

L4-5: 3 mm anterolisthesis. Diffuse disc bulge with disc
desiccation. Moderate facet and ligamentum flavum hypertrophy.
Resultant moderate canal with severe bilateral subarticular
stenosis, with mild to moderate bilateral L4 foraminal narrowing.

L5-S1: Mild diffuse disc bulge. Moderate facet and ligamentum flavum
hypertrophy, worse on the left. There is a superimposed 7 mm
synovial cyst at the anteromedial aspect of the left L5-S1 facet
(series 6, image 34). Resultant moderate to severe left lateral
recess narrowing with more mild right subarticular stenosis.
Moderate left with mild right L5 foraminal narrowing.
IMPRESSION: 1. 3 mm anterolisthesis of L4 on L5 with disc bulge and moderate
facet hypertrophy, resulting in moderate canal with severe bilateral
subarticular stenosis.
2. Left foraminal disc protrusion at L2-3 with resultant moderate
left lateral recess and foraminal stenosis, potentially affecting
either the left L2 or L3 nerve roots.
3. Disc bulge with left-sided facet hypertrophy at L5-S1 with
resultant moderate to severe left lateral recess stenosis with
moderate left L5 foraminal narrowing.
4. Chronic L2 compression fracture with approximately 50% height
loss without bony retropulsion.

## 2019-11-20 DIAGNOSIS — F418 Other specified anxiety disorders: Secondary | ICD-10-CM | POA: Insufficient documentation

## 2019-11-20 DIAGNOSIS — K5900 Constipation, unspecified: Secondary | ICD-10-CM | POA: Insufficient documentation

## 2019-11-23 DIAGNOSIS — I619 Nontraumatic intracerebral hemorrhage, unspecified: Secondary | ICD-10-CM | POA: Insufficient documentation

## 2020-12-30 ENCOUNTER — Ambulatory Visit (INDEPENDENT_AMBULATORY_CARE_PROVIDER_SITE_OTHER): Payer: Medicare Other | Admitting: Urology

## 2020-12-30 ENCOUNTER — Other Ambulatory Visit: Payer: Self-pay

## 2020-12-30 VITALS — BP 130/77 | HR 70

## 2020-12-30 DIAGNOSIS — R972 Elevated prostate specific antigen [PSA]: Secondary | ICD-10-CM | POA: Diagnosis not present

## 2020-12-30 DIAGNOSIS — R399 Unspecified symptoms and signs involving the genitourinary system: Secondary | ICD-10-CM | POA: Diagnosis not present

## 2020-12-30 DIAGNOSIS — N401 Enlarged prostate with lower urinary tract symptoms: Secondary | ICD-10-CM | POA: Diagnosis not present

## 2020-12-30 DIAGNOSIS — N138 Other obstructive and reflux uropathy: Secondary | ICD-10-CM | POA: Diagnosis not present

## 2020-12-30 LAB — MICROSCOPIC EXAMINATION: WBC, UA: >30 /hpf — AB (ref 0–5)

## 2020-12-30 LAB — URINALYSIS, COMPLETE
Bilirubin, UA: NEGATIVE
Glucose, UA: NEGATIVE
Ketones, UA: NEGATIVE
Nitrite, UA: NEGATIVE
Specific Gravity, UA: 1.025 (ref 1.005–1.030)
Urobilinogen, Ur: 0.2 mg/dL (ref 0.2–1.0)
pH, UA: 5 (ref 5.0–7.5)

## 2020-12-30 LAB — BLADDER SCAN AMB NON-IMAGING: Scan Result: 0

## 2020-12-30 NOTE — Progress Notes (Signed)
   12/30/20 12:52 PM   Douglas Hale [birth year] [id number]  CC: Elevated PSA  HPI: I saw Douglas Hale in urology clinic today for elevated PSA.  He is an 80 year old very comorbid male who resides in a rehab facility and has significant dementia.  I'm unable to obtain any history from the patient as he is very combative and extremely hard of hearing.  The history is obtained from chart review and his wife who is here with him today.  She denies any recent UTIs or urinary symptoms, and he continues to demand why he is here.  From my review of the chart, it sounds like he had a mildly elevated PSA of 9.16 that was checked on routine screening by his PCP.  He has a long history of intermittently elevated PSA including 7 in June 2016, 4.8 in November 2016, 3.4 in September 2017, and 3.16 in October 2019.  He previously deferred further work-up for his mildly elevated PSA at [facility] which is very reasonable based on his age and comorbidities.  Urinalysis is pending today, PVR is normal at 0 mL.  I personally reviewed and interpreted his CT scan from April 2018 that shows a 1 cm nonobstructive right lower pole stone and a 90 g prostate.  PSA density is reassuring at 0.10.  PMH: Past Medical History:  Diagnosis Date  . Coronary artery disease   . Hypertension   . Prostate enlargement   . Renal disorder   . Thyroid disease     Surgical History: Past Surgical History:  Procedure Laterality Date  . CHOLECYSTECTOMY    . lithotripsy      Family History: No family history on file.  Social History:  reports that he has never smoked. He has never used smokeless tobacco. He reports that he does not drink alcohol. No history on file for drug use.  Physical Exam: BP 130/77   Pulse 70    Constitutional: In wheelchair, combative, extremely hard of hearing  Laboratory Data: Reviewed, see HPI Urinalysis today pending  Pertinent Imaging: I personally reviewed and interpreted his CT scan from  April 2018 that shows a 1 cm nonobstructive right lower pole stone and a 90 g prostate.  Assessment & Plan:   He is an 80 year old male with dementia residing in a rehab facility who was referred for an elevated PSA of 9.16.  He denies any urinary symptoms, urinalysis is pending today, and PVR is normal at 0 mL.  He previously deferred further work-up for mildly elevated PSA at [facility] in the past with his comorbidities and age and I think this is very reasonable.  We reviewed the AUA guidelines that do not recommend routine PSA screening in men over age 68, as well as possible etiologies of a false elevation of PSA including infection, inflammation, and BPH.  His PSA density is very reassuring at 0.10.  With his comorbidities and dementia, in addition to his combativeness in clinic, he is not a good candidate for PSA screening or further evaluation.  Reassurance was provided.  Can follow-up with urology as needed, will call with urinalysis results  Legrand Rams, MD 12/30/2020  [facility] [address], [address] [place], [place] [address] [phone]

## 2020-12-30 NOTE — Addendum Note (Signed)
Addended by: Veneta Penton on: 12/30/2020 01:22 PM   Modules accepted: Orders

## 2021-01-06 LAB — CULTURE, URINE COMPREHENSIVE

## 2021-01-07 ENCOUNTER — Telehealth: Payer: Self-pay

## 2021-01-07 DIAGNOSIS — B9620 Unspecified Escherichia coli [E. coli] as the cause of diseases classified elsewhere: Secondary | ICD-10-CM

## 2021-01-07 DIAGNOSIS — B952 Enterococcus as the cause of diseases classified elsewhere: Secondary | ICD-10-CM

## 2021-01-07 DIAGNOSIS — N390 Urinary tract infection, site not specified: Secondary | ICD-10-CM

## 2021-01-07 NOTE — Telephone Encounter (Signed)
Called [facility] no answer. Held in call que for 5 mins. 1st attempt.

## 2021-01-07 NOTE — Telephone Encounter (Signed)
-----   Message from Sondra Come, MD sent at 01/06/2021  2:49 PM EST ----- His urine did grow out bacteria, recommend nitrofurantoin 100mg  BID x 7 days, thanks  , MD 01/06/2021

## 2021-01-08 MED ORDER — NITROFURANTOIN MONOHYD MACRO 100 MG PO CAPS: 100.0000 mg | ORAL_CAPSULE | Freq: Two times a day (BID) | ORAL | 0 refills | Status: DC

## 2021-01-08 NOTE — Telephone Encounter (Signed)
Called [facility] spoke with nurse manager who requests RX be faxed to them with cx results. Both faxed to [phone].

## 2021-02-26 ENCOUNTER — Other Ambulatory Visit: Payer: Self-pay

## 2021-02-26 ENCOUNTER — Non-Acute Institutional Stay: Payer: Medicare Other | Admitting: Primary Care

## 2021-02-26 ENCOUNTER — Encounter: Payer: Self-pay | Admitting: Primary Care

## 2021-02-26 DIAGNOSIS — F418 Other specified anxiety disorders: Secondary | ICD-10-CM

## 2021-02-26 DIAGNOSIS — Z515 Encounter for palliative care: Secondary | ICD-10-CM

## 2021-02-26 DIAGNOSIS — F0150 Vascular dementia without behavioral disturbance: Secondary | ICD-10-CM | POA: Insufficient documentation

## 2021-02-26 DIAGNOSIS — I10 Essential (primary) hypertension: Secondary | ICD-10-CM | POA: Insufficient documentation

## 2021-02-26 NOTE — Progress Notes (Signed)
[facility] Consult Note Telephone: [phone]  Fax: [phone]    Date of encounter: 02/26/21 PATIENT NAME: Douglas Hale [address] [place] [address]   [phone] (home)  DOB: Apr 27, 1941 MRN: [id number] PRIMARY CARE PROVIDER:    Rica Koyanagi, MD [place] [address] [phone]  REFERRING PROVIDER:   Rica Koyanagi, MD [place] [address] [phone] RESPONSIBLE PARTY:    Contact Information    Name Relation Home Work Mobile   [place],Patsy Spouse [phone]         I met face to face with patient [name] facility. Palliative Care was asked to follow this patient by consultation request of  Rica Koyanagi, MD  to address advance care planning and complex medical decision making. This is the initial visit.                                     ASSESSMENT AND PLAN / RECOMMENDATIONS:   Advance Care Planning/Goals of Care: Goals include to maximize quality of life and symptom management. Our advance care planning conversation included a discussion about:     The value and importance of advance care planning   Experiences with loved ones who have been seriously ill or have died   Exploration of personal, cultural or spiritual beliefs that might influence medical decisions   Exploration of goals of care in the event of a sudden injury or illness   Identification of a healthcare agent - wife  Review and updating or creation of an  advance directive document- MOST with DNR, comfort measures, limited use of abx and iv, no feeding tube  CODE STATUS: DNR  I reached [place] who discussed her husband's health journey. He had dementia for some years but fell in late 2020 and sustained head injury. Now resides at [facility].  She discussed how he has said he would not want to live like this prolonged. He had not wanted to do any ACP besides a will, wife endorses. The decision  making has fallen to her as of now. Pt has adult children who visit infrequently and from all over the country. 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 We discussed the dementia disease process and  Syndrome of frailty , or FTT. We discussed behavior as communication. He has been hiding medications in his wardrobe as well. We discussed his possible fears of dying. He would be a candidate for hospice services given their goal of care and his weight loss.   Symptom Management:  Anorexia: Lost 12 % weight or 25 lbs in 2 mos. Endorses ageusia from covid infection a year ago. Continue supplements, education if EOL decreased intake.  Fall risk/immobility: Reportedly does get up some but is a fall risk. Needs to have bed low esp at hs for prevention. Wife concerned as she has found pills hidden in his wardrobe.  Pain:  Denies but could be an issue. Needs to be assessed.  Follow up Palliative Care Visit: Palliative care will continue to follow for complex medical decision making, advance care planning, and clarification of goals. Return 2-4 weeks or prn.  I spent 45 minutes providing this consultation. More than 50% of the time in this consultation was spent in counseling and care coordination.  PPS: 30%  HOSPICE ELIGIBILITY/DIAGNOSIS: TBD  Chief  Complaint: abnormal weight loss, debility, frailty syndrome  HISTORY OF PRESENT ILLNESS:  Douglas Hale is a 80 y.o. year old male  with dementia x 6-8 years, exacerbated by  fall and an intraparenchymal brain hemorrhage. Dementia has behavior disturbances. Wife gave history, saying Douglas Hale has been agitated on many occasions and threatening self harm in the past. In past few months his decline has increased, manifesting frailty int he context of advancing dementia. He now has dysarthria and many times makes no sense. He is eating poorly now and has lost 25 lbs in the past 2 months. Wife wonders if this is  intentional  But patient endorses poor appetite, poor intake and  ageusia since covid infection. He refuses food but may take a few bites of take out. He has refused meds and she found several days' worth hidden in his wardrobe.   History obtained from review of EMR, discussion with primary team, and interview with family, facility staff/caregiver and/or Douglas. [place].  I reviewed available labs, medications, imaging, studies and related documents from the EMR.  Records reviewed and summarized above.   ROS/staff and wife  General: NAD ENMT: endorses pill dysphagia Cardiovascular: denies chest pain, denies DOE Pulmonary: denies cough, denies increased SOB Abdomen: endorses good appetite, denies constipation, endorses incontinence of bowel GU: denies dysuria, endorses incontinence of urine MSK:  Endorses weakness,  + falls reported/ high risk, wife reports ambulation, use of w/c Skin: denies rashes or wounds Neurological: denies pain, denies insomnia Psych: Endorses depressed and anxious mood Heme/lymph/immuno: denies bruises, abnormal bleeding  Physical Exam: Current and past weights: 149 lbs, endorses 25 lb wt loss in 2 months, 12% Constitutional: NAD General: frail appearing, thin  EYES: anicteric sclera, lids intact, no discharge  ENMT: intact hearing, oral mucous membranes moist CV: S1S2, RRR, no LE edema Pulmonary: LCTA, no increased work of breathing, no cough, room air Abdomen: intake 75%, normo-active BS + 4 quadrants, no ascites MSK: moderate sarcopenia, moves all extremities,  Skin: warm and dry, no rashes or wounds on visible skin Neuro:  +generalized weakness,  severe cognitive impairment Psych: non-anxious affect, A and O x 1 Hem/lymph/immuno: no widespread bruising   CURRENT PROBLEM LIST:  Patient Active Problem List   Diagnosis Date Noted  . Essential (primary) hypertension 02/26/2021  . Dementia, vascular ([facility] [place]) 02/26/2021  . Intraparenchymal hemorrhage of brain  ([place]) 11/23/2019  . Constipation 11/20/2019  . Depression with anxiety 11/20/2019  . 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 Hyperlipidemia 06/03/2015  . Benign non-nodular prostatic hyperplasia with lower urinary tract symptoms 01/11/2015  . Atherosclerotic heart disease of native coronary artery without angina pectoris 07/16/2014  . Hypothyroidism 07/16/2014   PAST MEDICAL HISTORY:  Active Ambulatory Problems    Diagnosis Date Noted  . Atherosclerotic heart disease of native coronary artery without angina pectoris 07/16/2014  . Benign non-nodular prostatic hyperplasia with lower urinary tract symptoms 01/11/2015  . 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 Constipation 11/20/2019  . Depression with anxiety 11/20/2019  . Essential (primary) hypertension 02/26/2021  . Intraparenchymal hemorrhage of brain ([place]) 11/23/2019  . Hypothyroidism 07/16/2014  . Hyperlipidemia 06/03/2015  . Dementia, vascular ([place]) 02/26/2021   Resolved Ambulatory Problems    Diagnosis Date Noted  . No Resolved Ambulatory Problems   Past Medical History:  Diagnosis Date  . Coronary artery disease   . Hypertension   . Prostate enlargement   . Renal disorder   . Thyroid disease    SOCIAL HX:  Social History  Tobacco Use  . Smoking status: Never Smoker  . Smokeless tobacco: Never Used  Substance Use Topics  . Alcohol use: No   FAMILY HX:  Family History  Problem Relation Age of Onset  . Heart disease Mother   . Kidney disease Father   . Kidney disease Brother    ALLERGIES:  Outpatient Encounter Medications as of 02/26/2021  Medication Sig  . acetaminophen (TYLENOL) 325 MG tablet Take 650 mg by mouth every 4 (four) hours as needed for moderate pain, mild pain or fever.  Marland Kitchen allopurinol (ZYLOPRIM) 300 MG tablet Take 1 tablet by mouth daily.  Marland Kitchen aspirin 81 MG EC tablet Take by mouth.  . divalproex (DEPAKOTE) 125 MG DR tablet Take 250 mg by mouth 3 (three) times daily.  .  finasteride (PROSCAR) 5 MG tablet Take by mouth.  . Glucosamine-Chondroitin 250-200 MG TABS Take 1 tablet by mouth daily.  Marland Kitchen levETIRAcetam (KEPPRA) 750 MG tablet Take 750 mg by mouth 2 (two) times daily.  Marland Kitchen levothyroxine (SYNTHROID) 175 MCG tablet Take 1 tablet by mouth daily.  Marland Kitchen LORazepam (ATIVAN) 0.5 MG tablet Take 0.5 mg by mouth 2 (two) times daily as needed for anxiety.  Marland Kitchen LORazepam (ATIVAN) 2 MG/ML concentrated solution Take 0.5 mg by mouth 2 (two) times daily as needed for anxiety.  Marland Kitchen losartan (COZAAR) 50 MG tablet Take 50 mg by mouth daily.  . Multiple Vitamin (MULTIVITAMIN WITH MINERALS) TABS tablet Take 1 tablet by mouth daily.  . polycarbophil (FIBERCON) 625 MG tablet Take 625 mg by mouth daily.  . polyethylene glycol (MIRALAX / GLYCOLAX) 17 g packet Take 17 g by mouth daily.  . pravastatin (PRAVACHOL) 20 MG tablet Take 1 tablet by mouth daily.  Marland Kitchen senna-docusate (SENOKOT-S) 8.6-50 MG tablet Take by mouth.  . sertraline (ZOLOFT) 100 MG tablet Take by mouth.  . traZODone (DESYREL) 50 MG tablet Take 50 mg by mouth at bedtime.  . nitrofurantoin, macrocrystal-monohydrate, (MACROBID) 100 MG capsule Take 1 capsule (100 mg total) by mouth 2 (two) times daily. (Patient not taking: Reported on 02/26/2021)  . [DISCONTINUED] amLODipine (NORVASC) 5 MG tablet Take by mouth.  . [DISCONTINUED] carisoprodol (SOMA) 350 MG tablet Take 1 tablet (350 mg total) by mouth 3 (three) times daily.  . [DISCONTINUED] carvedilol (COREG) 25 MG tablet Take by mouth.  . [DISCONTINUED] cyanocobalamin 1000 MCG tablet Take by mouth.  . [DISCONTINUED] diclofenac sodium (VOLTAREN) 1 % GEL Apply 2 g topically 4 (four) times daily.  . [DISCONTINUED] lisinopril (ZESTRIL) 10 MG tablet Take 1 tablet by mouth daily.  . [DISCONTINUED] oxyCODONE-acetaminophen (ROXICET) 5-325 MG tablet Take 1 tablet by mouth every 8 (eight) hours as needed.  . [DISCONTINUED] tamsulosin (FLOMAX) 0.4 MG CAPS capsule Take 2 capsules by mouth daily.   . [DISCONTINUED] traMADol (ULTRAM) 50 MG tablet Take by mouth.   No facility-administered encounter medications on file as of 02/26/2021.    Allergies  Allergen Reactions  . Escitalopram   . Hydrocodone Other (See Comments)    confusion  . Lexapro [Escitalopram Oxalate]   . Sulfa Antibiotics Rash     PERTINENT MEDICATIONS:   Thank you for the opportunity to participate in the care of Douglas. [place].  The palliative care team will continue to follow. Please call our office at [phone] if we can be of additional assistance.   Jason Coop, NP , DNP, MPH, AGPCNP-BC, ACHPN  COVID-19 PATIENT SCREENING TOOL Asked and negative response unless otherwise noted:   Have you had symptoms of  covid, tested positive or been in contact with someone with symptoms/positive test in the past 5-10 days?

## 2021-03-14 ENCOUNTER — Non-Acute Institutional Stay: Payer: Medicare Other | Admitting: Primary Care

## 2021-03-14 ENCOUNTER — Other Ambulatory Visit: Payer: Self-pay

## 2021-04-16 ENCOUNTER — Telehealth: Payer: Self-pay

## 2021-04-16 ENCOUNTER — Non-Acute Institutional Stay: Payer: Medicare Other | Admitting: Primary Care

## 2021-04-16 ENCOUNTER — Other Ambulatory Visit: Payer: Self-pay

## 2021-04-16 DIAGNOSIS — F418 Other specified anxiety disorders: Secondary | ICD-10-CM

## 2021-04-16 DIAGNOSIS — F0150 Vascular dementia without behavioral disturbance: Secondary | ICD-10-CM

## 2021-04-16 DIAGNOSIS — Z515 Encounter for palliative care: Secondary | ICD-10-CM

## 2021-04-16 NOTE — Progress Notes (Addendum)
[facility] Consult Note Telephone: [phone]  Fax: [phone]    Date of encounter: 04/16/21 PATIENT NAME: Douglas Hale [address] [place] [address]   [phone] (home)  DOB: Jul 30, 1941 MRN: [id number] PRIMARY CARE PROVIDER:    Rica Koyanagi, MD,  [place] [address] [phone]  REFERRING PROVIDER:   Rica Hale, [place],  [place] [address] [phone]  RESPONSIBLE PARTY:    Contact Information    Name Relation Home Work Mobile   Douglas Hale,Douglas Hale Spouse [phone]        I met face to face with patient and wife 80 in [facility]. Palliative Care was asked to follow this patient by consultation request of  Douglas Koyanagi, MD to address advance care planning and complex medical decision making. This is a follow up visit.                                   ASSESSMENT AND PLAN / RECOMMENDATIONS:   Advance Care Planning/Goals of Care: Goals include to maximize quality of life and symptom management. Our advance care planning conversation included a discussion about:     The value and importance of advance care planning   Experiences with loved ones who have been seriously ill or have died   Exploration of personal, cultural or spiritual beliefs that might influence medical decisions   Exploration of goals of care in the event of a sudden injury or illness   Review of an  advance directive document . CODE STATUS: DNR. Reviewed current MOST on file, no changes. Has comfort based goals of care. Symptom Management/Plan:  Hearing: Very poor and isolating. Wife has brought him an amplifier with ear bud. I recommend head  Phones to make conversation more facile. He wants to interact but cannot due to extreme hearing loss. He has lost 2 sets of hearing aids.  Disease Process: Advancing , memory worse. Endorses that he ambulates and has had no falls but he is a poor historian.    Nutrition: Endorses he can feed self. Wife states he gets shake and magic cup on tray. He has gained back some of his 20-25 lb loss from the spring, now at 156 lbs. He appears WNWD. Recommend to continue supplements as his baseline weight was 172 lbs. 6 months ago.  Caregiver: His children do not visit, has 6 kids. Current wife's children visit.  She states she feels supported by her family and visits patient frequently. We discussed Palliative Medicine and goals for being followed by [facility].  Follow up Palliative Care Visit: Palliative care will continue to follow for complex medical decision making, advance care planning, and clarification of goals. Return 12 weeks or prn.  I spent 30 minutes providing this consultation. More than 50% of the time in this consultation was spent in counseling and care coordination.  PPS: 40%  HOSPICE ELIGIBILITY/DIAGNOSIS: TBD  Chief Complaint: hearing loss  HISTORY OF PRESENT ILLNESS:  Douglas Hale is a 80 y.o. year old male  with advanced dementia, hearing loss, goals of care.   History obtained from review of EMR, discussion with primary team, and interview with family, facility staff/caregiver and/or Douglas Hale.  I reviewed available labs, medications, imaging, studies and related documents from the EMR.  Records reviewed and summarized above.   ROS  deferred  PE   Deferred  Thank you for  the opportunity to participate in the care of Douglas Hale.  The palliative care team will continue to follow. Please call our office at [phone] if we can be of additional assistance.   Douglas Coop, NP , DNP, MPH, AGPCNP-BC, ACHPN  COVID-19 PATIENT SCREENING TOOL Asked and negative response unless otherwise noted:   Have you had symptoms of covid, tested positive or been in contact with someone with symptoms/positive test in the past 5-10 days?

## 2021-04-16 NOTE — Telephone Encounter (Signed)
error 

## 2021-10-01 ENCOUNTER — Non-Acute Institutional Stay: Payer: Medicare Other | Admitting: Primary Care

## 2021-10-01 ENCOUNTER — Other Ambulatory Visit: Payer: Self-pay

## 2021-10-01 DIAGNOSIS — F01518 Vascular dementia, unspecified severity, with other behavioral disturbance: Secondary | ICD-10-CM

## 2021-10-01 DIAGNOSIS — Z87898 Personal history of other specified conditions: Secondary | ICD-10-CM

## 2021-10-01 DIAGNOSIS — F418 Other specified anxiety disorders: Secondary | ICD-10-CM

## 2021-10-01 DIAGNOSIS — Z515 Encounter for palliative care: Secondary | ICD-10-CM

## 2021-10-01 NOTE — Progress Notes (Signed)
[facility] Consult Note Telephone: [phone]  Fax: [phone]   Date of encounter: 10/01/21 1:27 PM PATIENT NAME: Douglas Hale [address] [place] [address]   [phone] (home)  DOB: 04-01-1941 MRN: [id number] PRIMARY CARE PROVIDER:    Rica Koyanagi, MD,  [place] [address] [phone]  REFERRING PROVIDER:   Rica Koyanagi, MD [address] [place],  [place] [address] [phone]  RESPONSIBLE PARTY:    Contact Information     Name Relation Home Work Mobile   Everard,Patsy Spouse [phone]          I met face to face with patient  in [facility] facility. Palliative Care was asked to follow this patient by consultation request of  Rica Koyanagi, MD to address advance care planning and complex medical decision making. This is a follow up visit.                                    ASSESSMENT AND PLAN / RECOMMENDATIONS:   Advance Care Planning/Goals of Care: Goals include to maximize quality of life and symptom management. Patient/health care surrogate gave his/her permission to discuss.Our advance care planning conversation included a discussion about:     CODE STATUS: DNR  Symptom Management/Plan: Visited patient in his room while lying in bed.    Disease Process: Advancing , memory worse. Patient with wander prevention device to ankle. Per staff patient is ambulatory with no falls. He has been provided a new room within the facility due to ongoing aggressive behaviors towards past roommates. Patient is a poor historian and suggests he requested his current room for the space. He is followed by psychiatry.   Nutrition: Endorses he can feed self. Provided shakes and magic cups on tray. Per facility records he is consuming 50-75% of meals. He has gained an additional 8 lbs from our last visit (164.1 lbs) BMI: 24.23 which is back from a 20-25 lb loss in the spring. He appears WNWD. Recommend  to continue supplements as his baseline weight was 172 lbs. 11 months ago.   Caregiver:  Wife is involved in patients care and visits patient frequently. Attempted to contact wife for follow-up, left message on voicemail.  Follow up Palliative Care Visit: Palliative care will continue to follow for complex medical decision making, advance care planning, and clarification of goals. Return 82 weeks or prn.  I spent 25 minutes providing this consultation. More than 50% of the time in this consultation was spent in counseling and care coordination.  PPS: 40%  HOSPICE ELIGIBILITY/DIAGNOSIS: no  Chief Complaint: Debility  HISTORY OF PRESENT ILLNESS:  Douglas Hale is a 80 y.o. year old male  with advanced dementia, agitation, hearing loss.  History obtained from review of EMR, discussion with primary team, and interview with family, facility staff/caregiver and/or Mr. Klaiber.  I reviewed available labs, medications, imaging, studies and related documents from the EMR.  Records reviewed and summarized above.   ROS/staff  General: NAD ENMT: denies dysphagia Cardiovascular: denies chest pain, denies DOE Pulmonary: denies cough, denies increased SOB Abdomen: endorses good appetite, denies constipation, endorses incontinence of bowel GU: denies dysuria, endorses incontinence of urine MSK:  denies weakness,  no falls reported Skin: denies rashes or wounds Neurological: denies pain, denies insomnia Psych: Endorses positive mood,agitated at times per staff Heme/lymph/immuno: denies bruises, abnormal bleeding  Physical Exam: Current and past  weights: 158.8 lbs  BMI: 23.45 (04/18/2021)  164.1 lbs  BMI: 24.23 10/01/2021 Constitutional: NAD General: WNWD  EYES: anicteric sclera, lids intact, no discharge  ENMT: hard of hearing, oral mucous membranes moist, dentition intact CV: non pitting RLE edema, no LLE edema Pulmonary: no increased work of breathing, no cough, room air Abdomen: intake  50-75%, normo-active BS + 4 quadrants, soft and non tender, no ascites GU: deferred MSK: mild sarcopenia, moves all extremities, ambulatory Skin: warm and dry, no rashes or wounds on visible skin Neuro:  no generalized weakness,  moderately severe  cognitive impairment Psych: non-anxious affect, A and O x 1 Hem/lymph/immuno: no widespread bruising CURRENT PROBLEM LIST:  Patient Active Problem List   Diagnosis Date Noted   Essential (primary) hypertension 02/26/2021   Dementia, 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[place]) 02/26/2021   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   Constipation 11/20/2019   Depression with anxiety 11/20/2019   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   Hyperlipidemia 06/03/2015   Benign non-nodular prostatic hyperplasia with lower urinary tract symptoms 01/11/2015   Atherosclerotic heart disease of native coronary artery without angina pectoris 07/16/2014   Hypothyroidism 07/16/2014   PAST MEDICAL HISTORY:  Active Ambulatory Problems    Diagnosis Date Noted   Atherosclerotic heart disease of native coronary artery without angina pectoris 07/16/2014   Benign non-nodular prostatic hyperplasia with lower urinary tract symptoms 01/11/2015   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   Constipation 11/20/2019   Depression with anxiety 11/20/2019   Essential (primary) hypertension 02/26/2021   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   Hypothyroidism 07/16/2014   Hyperlipidemia 06/03/2015   Dementia, 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[place]) 02/26/2021   Resolved Ambulatory Problems    Diagnosis Date Noted   No Resolved Ambulatory Problems   Past Medical History:  Diagnosis Date   Coronary artery disease    Hypertension    Prostate enlargement    Renal disorder    Thyroid disease    SOCIAL HX:  Social History   Tobacco Use   Smoking status: Never   Smokeless tobacco: Never  Substance Use Topics   Alcohol use: No   FAMILY HX:  Family History  Problem  Relation Age of Onset   Heart disease Mother    Kidney disease Father    Kidney disease Brother       ALLERGIES:  Allergies  Allergen Reactions   Escitalopram    Hydrocodone Other (See Comments)    confusion   Lexapro [Escitalopram Oxalate]    Sulfa Antibiotics Rash     PERTINENT MEDICATIONS:  Outpatient Encounter Medications as of 10/01/2021  Medication Sig   acetaminophen (TYLENOL) 325 MG tablet Take 650 mg by mouth every 4 (four) hours as needed for moderate pain, mild pain or fever.   allopurinol (ZYLOPRIM) 300 MG tablet Take 1 tablet by mouth daily.   aspirin 81 MG EC tablet Take by mouth.   divalproex (DEPAKOTE) 125 MG DR tablet Take 250 mg by mouth 3 (three) times daily.   finasteride (PROSCAR) 5 MG tablet Take by mouth.   Glucosamine-Chondroitin 250-200 MG TABS Take 1 tablet by mouth daily.   levETIRAcetam (KEPPRA) 750 MG tablet Take 750 mg by mouth 2 (two) times daily.   levothyroxine (SYNTHROID) 175 MCG tablet Take 1 tablet by mouth daily.   LORazepam (ATIVAN) 0.5 MG tablet Take 0.5 mg by mouth 2 (two) times daily as needed for anxiety.   LORazepam (ATIVAN) 2 MG/ML concentrated solution Take 0.5 mg by mouth 2 (two) times daily as needed  for anxiety.   losartan (COZAAR) 50 MG tablet Take 50 mg by mouth daily.   Multiple Vitamin (MULTIVITAMIN WITH MINERALS) TABS tablet Take 1 tablet by mouth daily.   nitrofurantoin, macrocrystal-monohydrate, (MACROBID) 100 MG capsule Take 1 capsule (100 mg total) by mouth 2 (two) times daily. (Patient not taking: Reported on 02/26/2021)   polycarbophil (FIBERCON) 625 MG tablet Take 625 mg by mouth daily.   polyethylene glycol (MIRALAX / GLYCOLAX) 17 g packet Take 17 g by mouth daily.   pravastatin (PRAVACHOL) 20 MG tablet Take 1 tablet by mouth daily.   senna-docusate (SENOKOT-S) 8.6-50 MG tablet Take by mouth.   sertraline (ZOLOFT) 100 MG tablet Take by mouth.   traZODone (DESYREL) 50 MG tablet Take 50 mg by mouth at bedtime.   No  facility-administered encounter medications on file as of 10/01/2021.   Thank you for the opportunity to participate in the care of Mr. Verville.  The palliative care team will continue to follow. Please call our office at [phone] if we can be of additional assistance.   Jason Coop, NP , DNP, AGPCNP-BC  COVID-19 PATIENT SCREENING TOOL Asked and negative response unless otherwise noted:  Have you had symptoms of covid, tested positive or been in contact with someone with symptoms/positive test in the past 5-10 days?

## 2021-11-25 ENCOUNTER — Other Ambulatory Visit: Payer: 59 | Admitting: Primary Care

## 2021-11-25 ENCOUNTER — Other Ambulatory Visit: Payer: Self-pay

## 2021-11-25 VITALS — Ht 70.0 in | Wt 163.0 lb

## 2021-11-25 DIAGNOSIS — F418 Other specified anxiety disorders: Secondary | ICD-10-CM

## 2021-11-25 DIAGNOSIS — F0150 Vascular dementia without behavioral disturbance: Secondary | ICD-10-CM

## 2021-11-25 DIAGNOSIS — Z515 Encounter for palliative care: Secondary | ICD-10-CM

## 2021-11-25 NOTE — Progress Notes (Addendum)
[facility] Consult Note Telephone: [phone]  Fax: [phone]    Date of encounter: 11/25/21 11:19 AM PATIENT NAME: Douglas Hale [address] [place] [address]   [phone] (home)  DOB: 08-Jan-1941 MRN: [id number] PRIMARY CARE PROVIDER:    Rica Koyanagi, MD,  [place] [address] [phone]  REFERRING PROVIDER:   Rica Koyanagi, MD [address] [place],  [place] [address] [phone]  RESPONSIBLE PARTY:    Contact Information     Name Relation Home Work Mobile   Cecere,Patsy Spouse [phone]         I met face [facility] at [facility]. Palliative Care was asked to follow this patient by consultation request of  Rica Koyanagi, MD to address advance care planning and complex medical decision making. This is a follow up visit.                                   ASSESSMENT AND PLAN / RECOMMENDATIONS:   Advance Care Planning/Goals of Care: Goals include to maximize quality of life and symptom management. Patient/health care surrogate gave his/her permission to discuss.Our advance care planning conversation included a discussion about:    Exploration of personal, cultural or spiritual beliefs that might influence medical decisions  Exploration of goals of care in the event of a sudden injury or illness  Identification of a healthcare agent - wife Review  of an  advance directive document . I  reviewed a MOST form today. The patient and family outlined their wishes for the following treatment decisions:  Cardiopulmonary Resuscitation: Do Not Attempt Resuscitation (DNR/No CPR)  Medical Interventions: Comfort Measures: Keep clean, warm, and dry. Use medication by any route, positioning, wound care, and other measures to relieve pain and suffering. Use oxygen, suction and manual treatment of airway obstruction as needed for comfort. Do not transfer to the hospital unless comfort needs  cannot be met in current location.  Antibiotics: Determine use of limitation of antibiotics when infection occurs  IV Fluids: IV fluids for a defined trial period  Feeding Tube: No feeding tube   CODE STATUS: DNR  Symptom Management/Plan:   Mood: Wife endorses some sundowning, starting at around 1 or 2 pm.  Discussed accommodating behaviors as long as he's not hurting himself or impeding care.Endorses times of more alertness and other times withdrawn.   Intake/ nutrition: He does not always eat the meals, but has snacks. Stable at 164 lbs. Eating about 50-75%   Adls; Wife states he toilets self, he appears mostly incontinent. Staff may need to help with his toileting as it appears to be getting less reliable. Tries to toilet self per wife which could be a fall risk.  Dental Care: Needs daily dental care. Wife wants pt to see dentist. Needs cleaning to optimize oral health.  Follow up Palliative Care Visit: Palliative care will continue to follow for complex medical decision making, advance care planning, and clarification of goals. Return 6 weeks or prn.  This visit was coded based on medical decision making (MDM).  PPS: 30%  HOSPICE ELIGIBILITY/DIAGNOSIS: no  Chief Complaint: dementia  HISTORY OF PRESENT ILLNESS:  Douglas Hale is a 81 y.o. year old male  with vascular dementia, CAD, HTN .   History obtained from review of EMR, discussion with primary team, and interview with family, facility staff/caregiver and/or Douglas Hale.  I  reviewed available labs, medications, imaging, studies and related documents from the EMR.  Records reviewed and summarized above.   ROS/staff   General: NAD ENMT: denies dysphagia Pulmonary: denies cough, denies increased SOB Abdomen: endorses good appetite, denies constipation, endorses incontinence of bowel GU: denies dysuria, endorses  incontinence of urine MSK:  denies  increased weakness,  no falls reported Skin: denies rashes or  wounds Neurological: denies pain, denies insomnia Psych: Endorses positive mood, sundowning at 1 pm Heme/lymph/immuno: denies bruises, abnormal bleeding  Physical Exam: Current and past weights: 164 lbs Constitutional: NAD General: frail appearing,  wnwd EYES: anicteric sclera, lids intact, no discharge  ENMT: intact hearing, oral mucous membranes moist, dentition intact but needs cleaning CV: no LE edema Pulmonary: no increased work of breathing, no cough, room air Abdomen: intake 50-75%, no ascites GU: deferred MSK: mod  sarcopenia, moves all extremities, ambulatory per wife report Skin: warm and dry, no rashes or wounds on visible skin Neuro:  + generalized weakness,  severe  cognitive impairment Psych: non-anxious affect, A and O x 1 Hem/lymph/immuno: no widespread bruising Medication review:  Outpatient Encounter Medications as of 11/25/2021  Medication Sig   acetaminophen (TYLENOL) 325 MG tablet Take 650 mg by mouth every 4 (four) hours as needed for moderate pain, mild pain or fever.   allopurinol (ZYLOPRIM) 300 MG tablet Take 1 tablet by mouth daily.   aspirin 81 MG EC tablet Take by mouth.   divalproex (DEPAKOTE) 125 MG DR tablet Take 250 mg by mouth 2 (two) times daily.   finasteride (PROSCAR) 5 MG tablet Take by mouth.   levETIRAcetam (KEPPRA) 750 MG tablet Take 750 mg by mouth 2 (two) times daily.   levothyroxine (SYNTHROID) 175 MCG tablet Take 1 tablet by mouth daily.   metoprolol succinate (TOPROL-XL) 25 MG 24 hr tablet Take 25 mg by mouth daily.   mirtazapine (REMERON) 7.5 MG tablet Take 7.5 mg by mouth at bedtime.   Multiple Vitamin (MULTIVITAMIN WITH MINERALS) TABS tablet Take 1 tablet by mouth daily.   Polyethyl Glyc-Propyl Glyc PF (SYSTANE HYDRATION PF) 0.4-0.3 % SOLN Apply 1 drop to eye 2 (two) times daily.   polyethylene glycol (MIRALAX / GLYCOLAX) 17 g packet Take 17 g by mouth daily.   pravastatin (PRAVACHOL) 20 MG tablet Take 1 tablet by mouth daily.    sertraline (ZOLOFT) 100 MG tablet Take by mouth.   traZODone (DESYREL) 50 MG tablet Take 50 mg by mouth at bedtime.   [DISCONTINUED] Glucosamine-Chondroitin 250-200 MG TABS Take 1 tablet by mouth daily.   [DISCONTINUED] LORazepam (ATIVAN) 0.5 MG tablet Take 0.5 mg by mouth 2 (two) times daily as needed for anxiety.   [DISCONTINUED] LORazepam (ATIVAN) 2 MG/ML concentrated solution Take 0.5 mg by mouth 2 (two) times daily as needed for anxiety.   [DISCONTINUED] losartan (COZAAR) 50 MG tablet Take 50 mg by mouth daily.   [DISCONTINUED] nitrofurantoin, macrocrystal-monohydrate, (MACROBID) 100 MG capsule Take 1 capsule (100 mg total) by mouth 2 (two) times daily. (Patient not taking: Reported on 02/26/2021)   [DISCONTINUED] polycarbophil (FIBERCON) 625 MG tablet Take 625 mg by mouth daily.   [DISCONTINUED] senna-docusate (SENOKOT-S) 8.6-50 MG tablet Take by mouth.   No facility-administered encounter medications on file as of 11/25/2021.     Thank you for the opportunity to participate in the care of Douglas Hale.  The palliative care team will continue to follow. Please call our office at [phone] if we can be of additional assistance.   Jason Coop,  NP DNP, AGPCNP-BC  COVID-19 PATIENT SCREENING TOOL Asked and negative response unless otherwise noted:   Have you had symptoms of covid, tested positive or been in contact with someone with symptoms/positive test in the past 5-10 days?

## 2021-11-25 NOTE — Addendum Note (Signed)
Addended by: Marijo File on: 11/25/2021 02:02 PM   Modules accepted: Orders

## 2022-03-20 ENCOUNTER — Non-Acute Institutional Stay: Payer: 59 | Admitting: Primary Care

## 2022-03-23 ENCOUNTER — Non-Acute Institutional Stay: Payer: 59 | Admitting: Primary Care

## 2022-03-23 DIAGNOSIS — F0150 Vascular dementia without behavioral disturbance: Secondary | ICD-10-CM

## 2022-03-23 DIAGNOSIS — Z515 Encounter for palliative care: Secondary | ICD-10-CM

## 2022-03-23 DIAGNOSIS — Z87898 Personal history of other specified conditions: Secondary | ICD-10-CM

## 2022-03-23 NOTE — Progress Notes (Signed)
? ? ?[facility] ?[facility] Consult Note ?Telephone: [phone]  ?Fax: [phone]  ? ? ?Date of encounter: 03/23/22 ?1:46 PM ?PATIENT NAME: Douglas Hale ?[place] ?[place] [address]   ?[phone] (home)  ?DOB: 1941/03/20 ?MRN: [id number] ?PRIMARY CARE PROVIDER:    ?Douglas Koyanagi, MD,  ?[place] [address] ?[phone] ? ?REFERRING PROVIDER:   ?Douglas Koyanagi, MD ?[place],  [place] [address] ?[phone] ? ?RESPONSIBLE PARTY:    ?Contact Information   ? ? Name Relation Home Work Mobile  ? Hale,Douglas Spouse [phone]    ? ?  ? ? ? ?I met face to face with patient in [facility]. Palliative Care was asked to follow this patient by consultation request of  Douglas Koyanagi, MD to address advance care planning and complex medical decision making. This is a follow up visit. ? ?                                 ASSESSMENT AND PLAN / RECOMMENDATIONS:  ? ?Advance Care Planning/Goals of Care: Goals include to maximize quality of life and symptom management. Patient/health care surrogate gave his/her permission to discuss.Our advance care planning conversation included a discussion about:    ? ? ?Exploration of personal, cultural or spiritual beliefs that might influence medical decisions  ?Identification of a healthcare agent - wife ? ?CODE STATUS:  DNR ? ?Symptom Management/Plan: ? ?Met with patient in his nursing home room. He was sitting in a geri  chair eating his lunch. He actually had only taken the mighty shake and decline to eat the food. He stated it was not appealing. He was able to show me his harmonica collection and play some tunes. This was functionality I have not seen in the past. Staff states that he has been more functional and interactive as of late. I spoke with his wife his power of attorney later and she did not have any concerns outside of his variable intake. One thing she said is he could order a grilled cheese sandwich  or something peanut butter etc. but is not remembering to do that prior to meals coming out. She does not feel he's is not getting food as she has many snacks for him in the room.  He was able to converse and tells stories although wife states they are likely implausible. She also states that he has been calling her a few times at 2,3 and 4 AM. If he need something to help him sleep at HS we could consider. I would recommend melatonin first if not already on it or if not contraindicated.   ? ?Follow up Palliative Care Visit: Palliative care will continue to follow for complex medical decision making, advance care planning, and clarification of goals. Return 8-12 weeks or prn. ? ?I spent 35 minutes providing this consultation. More than 50% of the time in this consultation was spent in counseling and care coordination. ? ?PPS: 40% ? ?HOSPICE ELIGIBILITY/DIAGNOSIS: no ? ?Chief Complaint: debility, dementia ? ?HISTORY OF PRESENT ILLNESS:  Douglas Hale is a 81 y.o. year old male  with advancing dementia, debility . Patient seen today to review palliative care needs to include medical decision making and advance care planning as appropriate.  ? ?History obtained from review of EMR, discussion with primary team, and interview with family, facility staff/caregiver and/or Douglas Hale.  ?I reviewed available labs, medications, imaging, studies  and related documents from the EMR.  Records reviewed and summarized above.  ? ?ROS ? ? ?General: NAD ?EYES: denies vision changes ?ENMT: denies dysphagia ?Cardiovascular: denies chest pain, denies DOE ?Pulmonary: denies cough, denies increased SOB ?Abdomen: endorses fair  appetite, denies constipation, endorses continence of bowel at times  ?GU: denies dysuria, endorses continence of urine at times  ?MSK:  denies  increased weakness,  no falls reported ?Skin: denies rashes or wounds ?Neurological: denies pain, denies insomnia ?Psych: Endorses positive mood ? ?Physical Exam: ?Current  and past weights:stable at 165 lbs ?Constitutional: NAD ?General: frail appearing, twnwd ?EYES: anicteric sclera, lids intact, no discharge  ?ENMT: intact hearing, oral mucous membranes moist, dentition intact ?CV: no LE edema ?Pulmonary: no increased work of breathing, no cough, room air ?Abdomen: intake 50%, no ascites ?MSK: +sarcopenia, moves all extremities, ambulatory to bathroom but needs assistance ?Skin: warm and dry, no rashes or wounds on visible skin ?Neuro:  + generalized weakness,  severe cognitive impairment, non-anxious affect ? ? ?Thank you for the opportunity to participate in the care of Douglas Hale.  The palliative care team will continue to follow. Please call our office at [phone] if we can be of additional assistance.  ? ?Douglas Coop, NP DNP, AGPCNP-BC ? ?COVID-19 PATIENT SCREENING TOOL ?Asked and negative response unless otherwise noted:  ? ?Have you had symptoms of covid, tested positive or been in contact with someone with symptoms/positive test in the past 5-10 days?  ? ?

## 2022-06-16 ENCOUNTER — Non-Acute Institutional Stay: Payer: 59 | Admitting: Primary Care

## 2022-06-16 DIAGNOSIS — F0150 Vascular dementia without behavioral disturbance: Secondary | ICD-10-CM

## 2022-06-16 DIAGNOSIS — Z87898 Personal history of other specified conditions: Secondary | ICD-10-CM

## 2022-06-16 NOTE — Progress Notes (Signed)
[facility] Consult Note Telephone: [phone]  Fax: [phone]    Date of encounter: 06/16/22 3:29 PM PATIENT NAME: Douglas Hale [address] [place] [address]   [phone] (home)  DOB: February 24, 1941 MRN: [id number] PRIMARY CARE PROVIDER:    Rica Koyanagi, MD,  [place] [address] [phone]  REFERRING PROVIDER:   Rica Koyanagi, MD [address] [place],  [place] [address] [phone]  RESPONSIBLE PARTY:    Contact Information     Name Relation Home Work Mobile   Service,Douglas Hale Spouse [phone]          I met face to face with [facility] in [facility]. Palliative Care was asked to follow this patient by consultation request of  Rica Koyanagi, MD to address advance care planning and complex medical decision making. This is a follow up visit.                                   ASSESSMENT AND PLAN / RECOMMENDATIONS:   Advance Care Planning/Goals of Care: Goals include to maximize quality of life and symptom management. Patient/health care surrogate gave his/her permission to discuss.Our advance care planning conversation included a discussion about:    The value and importance of advance care planning  Experiences with loved ones who have been seriously ill or have died  Exploration of personal, cultural or spiritual beliefs that might influence medical decisions  Exploration of goals of care in the event of a sudden injury or illness  Identification of a healthcare agent  Review and updating or creation of an  advance directive document . Decision not to resuscitate or to de-escalate disease focused treatments due to poor prognosis. CODE STATUS: DNR  Symptom Management/Plan:    Follow up Palliative Care Visit: Palliative care will continue to follow for complex medical decision making, advance care planning, and clarification of goals. Return *** weeks or prn.  I spent *** minutes providing  this consultation. More than 50% of the time in this consultation was spent in counseling and care coordination.  This visit was coded based on medical decision making (MDM).***  PPS: ***0%  HOSPICE ELIGIBILITY/DIAGNOSIS: TBD  Chief Complaint: ***  HISTORY OF PRESENT ILLNESS:  Douglas Hale is a 81 y.o. year old male  with *** . Patient seen today to review palliative care needs to include medical decision making and advance care planning as appropriate.   History obtained from review of EMR, discussion with primary team, and interview with family, facility staff/caregiver and/or Douglas Hale.  I reviewed available labs, medications, imaging, studies and related documents from the EMR.  Records reviewed and summarized above.   ROS  *** General: NAD EYES: denies vision changes ENMT: denies dysphagia Cardiovascular: denies chest pain, denies DOE Pulmonary: denies cough, denies increased SOB Abdomen: endorses good appetite, denies constipation, endorses continence of bowel GU: denies dysuria, endorses continence of urine MSK:  denies  increased weakness,  no falls reported Skin: denies rashes or wounds Neurological: denies pain, denies insomnia Psych: Endorses positive mood  Physical Exam: Current and past weights: 165 lbs and stable  Constitutional: NAD General: frail appearing, WNWD EYES: anicteric sclera, lids intact, no discharge  ENMT: intact hearing, oral mucous membranes moist, dentition intact CV:no LE edema Pulmonary: LCTA, no increased work of breathing, no cough, room air Abdomen: intake 100%, normo-active BS + 4 quadrants, soft and non tender,  no ascites MSK: + sarcopenia, moves all extremities,  non ambulatory Skin: warm and dry, no rashes or wounds on visible skin Neuro:  no generalized weakness,  advanced cognitive impairment, non-anxious affect, able to converse but not purposeful.    Thank you for the opportunity to participate in the care of Douglas Hale.  The  palliative care team will continue to follow. Please call our office at [phone] if we can be of additional assistance.   Douglas Coop, NP DNP, AGPCNP-BC  COVID-19 PATIENT SCREENING TOOL Asked and negative response unless otherwise noted:   Have you had symptoms of covid, tested positive or been in contact with someone with symptoms/positive test in the past 5-10 days?

## 2022-09-25 ENCOUNTER — Non-Acute Institutional Stay: Payer: 59 | Admitting: Primary Care

## 2022-09-25 DIAGNOSIS — Z515 Encounter for palliative care: Secondary | ICD-10-CM

## 2022-09-25 DIAGNOSIS — F0150 Vascular dementia without behavioral disturbance: Secondary | ICD-10-CM

## 2022-09-25 NOTE — Progress Notes (Signed)
[facility] Consult Note Telephone: [phone]  Fax: [phone]    Date of encounter: 09/25/22 11:13 AM PATIENT NAME: Douglas Hale [address] [place] [address]   [phone] (home)  DOB: 08-25-1941 MRN: [id number] PRIMARY CARE PROVIDER:    Rica Koyanagi, MD,  [place] [address] [phone]  REFERRING PROVIDER:   Rica Koyanagi, [place],  [place] [address] [phone]  RESPONSIBLE PARTY:    Contact Information     Name Relation Home Work Mobile   Pelot,Patsy Spouse [phone]         I met face to face with patient  in [facility]. Palliative Care was asked to follow this patient by consultation request of  Rica Koyanagi, MD to address advance care planning and complex medical decision making. This is a follow up visit.                                   ASSESSMENT AND PLAN / RECOMMENDATIONS:   Advance Care Planning/Goals of Care: Goals include to maximize quality of life and symptom management.  CODE STATUS: DNR  Symptom Management/Plan:  Staff states patient is at his baseline mentally. Today he is not interested in talking. Staff state several falls in the past few weeks but was not sent out.  Of concern is a 10 % weight loss over the past 3 months.  He may benefit from revisiting of psychiatric medication if his agitation is less and due to weight loss. Recommend dietary supplements. He is oob every day per staff.  Follow up Palliative Care Visit: Palliative care will continue to follow for complex medical decision making, advance care planning, and clarification of goals. Return 6 weeks or prn.  I spent 15 minutes providing this consultation. More than 50% of the time in this consultation was spent in counseling and care coordination.   PPS: 30%  HOSPICE ELIGIBILITY/DIAGNOSIS: yes/ weight loss  Chief Complaint: debility, weight loss  HISTORY OF PRESENT ILLNESS:   Douglas Hale is a 81 y.o. year old male  with weight loss, debility, vascular dementia, s/p CVA .   History obtained from review of EMR, discussion with primary team, and interview with family, facility staff/caregiver and/or Mr. Chap.  I reviewed available labs, medications, imaging, studies and related documents from the EMR.  Records reviewed and summarized above.   ROS/staff   General: NAD ENMT: denies dysphagia Cardiovascular: denies chest pain, denies DOE Pulmonary: denies cough, denies increased SOB Abdomen: endorses good to fair  appetite, denies constipation, endorses incontinence of bowel GU: denies dysuria, endorses incontinence of urine MSK:  denies increased weakness,  +falls reported Skin: denies rashes or wounds Neurological: denies pain, denies insomnia Psych: Endorses positive mood Heme/lymph/immuno: denies bruises, abnormal bleeding  Physical Exam: Current and past weights: 10% weight loss in 3 mos, now 152 lbs. Constitutional: NAD General: frail appearing, WNWD EYES: anicteric sclera, lids intact, no discharge  CV:  1-2+  LE edema Pulmonary: no increased work of breathing, no cough, room air Abdomen: intake 50-75%,soft and non tender, no ascites MSK:+ sarcopenia, moves all extremities,  non- ambulatory Skin: warm and dry, no rashes or wounds on visible skin Neuro:  + generalized weakness,  + cognitive impairment Psych: non-anxious affect, A and O x 1 Hem/lymph/immuno: no widespread bruising   Thank you for the opportunity to participate in the care of  Mr. Delatte. Please call our office at [phone] if we can be of additional assistance.   Jason Coop DNP, MPH, AGPCNP-BC, ACHPN   COVID-19 PATIENT SCREENING TOOL Asked and negative response unless otherwise noted:   Have you had symptoms of covid, tested positive or been in contact with someone with symptoms/positive test in the past 5-10 days?

## 2022-11-19 ENCOUNTER — Non-Acute Institutional Stay: Payer: 59 | Admitting: Nurse Practitioner

## 2022-11-19 DIAGNOSIS — Z515 Encounter for palliative care: Secondary | ICD-10-CM

## 2022-11-19 DIAGNOSIS — F0150 Vascular dementia without behavioral disturbance: Secondary | ICD-10-CM

## 2022-11-19 DIAGNOSIS — R630 Anorexia: Secondary | ICD-10-CM

## 2022-11-19 NOTE — Progress Notes (Signed)
[facility] Consult Note Telephone: [phone]  Fax: [phone]    Date of encounter: 11/19/22 3:47 PM PATIENT NAME: Douglas Hale [address] [place] [address]   [phone] (home)  DOB: 06-18-1941 MRN: [id number] PRIMARY CARE PROVIDER:    [facility] Heal  RESPONSIBLE PARTY:    Contact Information     Name Relation Home Work Mobile   Seneca,Patsy Spouse [phone]        I met face to face with patient and family in facility. Palliative Care was asked to follow this patient by consultation request of  [facility] to address advance care planning and complex medical decision making. This is a follow up visit.                                  ASSESSMENT AND PLAN / RECOMMENDATIONS:  Symptom Management/Plan: 1. Advance Care Planning;  DNR  Currently with 4 lbs weight gain, will continue to monitor, follow in pc for appetite, weights, chronic disease progression, overall decline with goal if >10% body loss, may benefit from hospice though with weight gain and no other criteria met will continue with pc, supportive role, symptoms.   2. Goals of Care: Goals include to maximize quality of life and symptom management. Our advance care planning conversation included a discussion about:    The value and importance of advance care planning  Exploration of personal, cultural or spiritual beliefs that might influence medical decisions  Exploration of goals of care in the event of a sudden injury or illness  Identification and preparation of a healthcare agent  Review and updating or creation of an advance directive document. 3. Palliative care encounter; Palliative care encounter; Palliative medicine team will continue to support patient, patient's family, and medical team. Visit consisted of counseling and education dealing with the complex and emotionally intense issues of symptom management and palliative care in the  setting of serious and potentially life-threatening illness  4. Weight loss, anorexia; reviewed weights, continue to monitor weights, encourage meals, supplements, routine labs. Possible if continue to loose weight may benefit from looking at option of hospice for supportive care should loose >10% body weight, though no wounds, no other s/s, no recent infections, changes in functional abilities.  06/16/2022 weight 166.5 lbs 09/25/2022 weight 152 lbs 1/12/18/2022 weight 156 lbs Currently weight gain of 4 lbs Follow up Palliative Care Visit: Palliative care will continue to follow for complex medical decision making, advance care planning, and clarification of goals. Return 4 to 8  weeks or prn.  I spent 45 minutes providing this consultation. More than 50% of the time in this consultation was spent in counseling and care coordination. PPS:   Chief Complaint: Follow up palliative consult for complex medical decision making, address goals, manage ongoing symptoms  HISTORY OF PRESENT ILLNESS:  Douglas Hale is a 82 y.o. year old male  with multiple medical problems including Vascular Dementia, intraparenchymal hemorrhage of brain, HTN, cerebrovascular small vessel disease, CAD, hypothyroidism, BPH, HLD, h/o constipation, depression, anxiety. Mr Weideman resides LTC at [facility]. Mr Bradly does require assistance functionally with transfers with h/o multiple falls, requires assistance for bathing, dressing. Mr Riling does feed himself with improving appetite per staff and slight weight gain with current weight 156 lbs. No recent hospitalizations, infections, wounds. At present Mr Highfill Is lying in bed, making eye contact, answering questions. Mr Cordrey  was cooperative though limited with cognitive impairment. Mr Parham was focused on breakfast. Attempted to get Mr Moder to get oob, though declined. Support provided. Medical goals, poc. Medications reviewed, will continue to monitor weights, follow  pc for weights, symptoms currently stable, asymptomatic, disease progression, fall risk. Updated staff. Attempted to contact Ms Mcloud.   History obtained from review of EMR, discussion with primary team, and interview with family, facility staff/caregiver and/or Mr. Snellgrove.  I reviewed available labs, medications, imaging, studies and related documents from the EMR.  Records reviewed and summarized above.  Physical Exam: Constitutional: NAD General: interactive, pleasant male ENMT: oral mucous membranes moist CV: S1S2, RRR Pulmonary: LCTA Abdomen: soft and non tender Skin: warm and dry Neuro:  + generalized weakness,  + cognitive impairment Psych: non-anxious affect, A and O x 2 Thank you for the opportunity to participate in the care of Mr. Massi. Please call our office at [phone] if we can be of additional assistance.    Ihor Gully, NP

## 2022-12-29 ENCOUNTER — Non-Acute Institutional Stay: Payer: 59 | Admitting: Nurse Practitioner

## 2022-12-29 ENCOUNTER — Encounter: Payer: Self-pay | Admitting: Nurse Practitioner

## 2022-12-29 DIAGNOSIS — F0150 Vascular dementia without behavioral disturbance: Secondary | ICD-10-CM

## 2022-12-29 DIAGNOSIS — R630 Anorexia: Secondary | ICD-10-CM

## 2022-12-29 DIAGNOSIS — R634 Abnormal weight loss: Secondary | ICD-10-CM

## 2022-12-29 DIAGNOSIS — Z515 Encounter for palliative care: Secondary | ICD-10-CM

## 2022-12-29 NOTE — Progress Notes (Signed)
[facility] Consult Note Telephone: [phone]  Fax: [phone]    Date of encounter: 12/29/22 1:38 PM PATIENT NAME: Douglas Hale [address] [place] [address]   [phone] (home)  DOB: 1940-12-01 MRN: [id number] PRIMARY CARE PROVIDER:    [facility]  RESPONSIBLE PARTY:    Contact Information     Name Relation Home Work Mobile   Douglas Hale,Douglas Hale Spouse [phone]       I met face to face with patient and family in facility. Palliative Care was asked to follow this patient by consultation request of  [facility] to address advance care planning and complex medical decision making. This is a follow up visit.                                  ASSESSMENT AND PLAN / RECOMMENDATIONS:  Symptom Management/Plan: 1. Advance Care Planning;  DNR   2. Goals of Care: Goals include to maximize quality of life and symptom management. Our advance care planning conversation included a discussion about:    The value and importance of advance care planning  Exploration of personal, cultural or spiritual beliefs that might influence medical decisions  Exploration of goals of care in the event of a sudden injury or illness  Identification and preparation of a healthcare agent  Review and updating or creation of an advance directive document. 3. Palliative care encounter; Palliative care encounter; Palliative medicine team will continue to support patient, patient's family, and medical team. Visit consisted of counseling and education dealing with the complex and emotionally intense issues of symptom management and palliative care in the setting of serious and potentially life-threatening illness   4. Weight loss, anorexia; reviewed weights, continue to monitor weights, encourage meals, supplements, routine labs. Possible if continue to loose weight may benefit from looking at option of hospice for supportive care should loose >10% body  weight, though no wounds, no other s/s, no recent infections, changes in functional abilities.  06/16/2022 weight 166.5 lbs 09/25/2022 weight 152 lbs 1/12/18/2022 weight 156 lbs 12/18/2022 weight 154 lbs 12.5 lbs/6 months; 7.51% Follow up Palliative Care Visit: PC f/u visit further discussion monitor trends of appetite, weights, monitor for functional, cognitive decline with chronic disease progression, assess any active symptoms, supportive role.Palliative care will continue to follow for complex medical decision making, advance care planning, and clarification of goals. Return 4 to 8  weeks or prn.   I spent 47 minutes providing this consultation starting at 12:10 pm. More than 50% of the time in this consultation was spent in counseling and care coordination. PPS:    Chief Complaint: Follow up palliative consult for complex medical decision making, address goals, manage ongoing symptoms   HISTORY OF PRESENT ILLNESS:  Douglas Hale is a 82 y.o. year old male  with multiple medical problems including Vascular Dementia, intraparenchymal hemorrhage of brain, HTN, cerebrovascular small vessel disease, CAD, hypothyroidism, BPH, HLD, h/o constipation, depression, anxiety. Douglas Hale resides LTC at [facility]. Douglas Hale does require assistance functionally with transfers with h/o multiple falls, requires assistance for bathing, dressing. Douglas Hale does feed himself  appetite being fair to good depending on what is being served. No recent falls, wounds, hospitalizations, infections per staff. Purpose of today PC f/u visit further discussion monitor trends of appetite, weights, monitor for functional, cognitive decline with chronic disease progression, assess any active symptoms, supportive role. At present  Douglas Hale I lying in bed, appears comfortable. No visitors present. Douglas Hale does make eye contact, engaging, cooperative with assessment. Limited discussion with cognitive impairment. Support provided.  Medications, medical goals, poc reviewed. Attempted to contact wife, Ms Hale, message left with contact information to return call about pc visit. Updated staff, continue to monitor weights, encourage Douglas Hale to eat, nutrition, oob as able    History obtained from review of EMR, discussion with primary team, and interview with family, facility staff/caregiver and/or Douglas. Hale.  I reviewed available labs, medications, imaging, studies and related documents from the EMR.  Records reviewed and summarized above.  Physical Exam: Constitutional: NAD General: interactive, pleasant male ENMT: oral mucous membranes moist CV: S1S2, RRR Pulmonary: LCTA Abdomen: soft and non tender Neuro:  + generalized weakness,  + cognitive impairment Psych: non-anxious affect, Alert, engaging making eye contact, few words clear Thank you for the opportunity to participate in the care of Douglas. Hale. Please call our office at [phone] if we can be of additional assistance.   Dorathy Stallone Ihor Gully, NP

## 2023-01-06 ENCOUNTER — Telehealth: Payer: Self-pay | Admitting: Nurse Practitioner

## 2023-01-06 NOTE — Telephone Encounter (Signed)
I returned Cleotis Nipper, Mr Kirkpatrick which she was returning my call. Message left with contact information

## 2023-01-08 ENCOUNTER — Telehealth: Payer: Self-pay | Admitting: Nurse Practitioner

## 2023-01-08 NOTE — Telephone Encounter (Signed)
Returning call about pc visit, message left with contact information to return call

## 2023-01-11 ENCOUNTER — Telehealth: Payer: Self-pay | Admitting: Nurse Practitioner

## 2023-01-11 NOTE — Telephone Encounter (Signed)
Douglas Hale [name] wife returned call, clinical update given, talked about pc visit, discussed functional changes, cognitive decline. Medical goals discussed. We talked about poc. We talked about appetite, weight loss, will see what next month weight is and how he does functionally/cognitively. May need to look at hospice as an optional choice if he continues to loose weight >10%. Therapeutic listening, emotional support provided. Questions answered.   Total time 20 minutes Phone discussion 15 minutes Documentation 5 minutes

## 2023-03-22 ENCOUNTER — Encounter: Payer: Self-pay | Admitting: Nurse Practitioner

## 2023-03-22 ENCOUNTER — Non-Acute Institutional Stay: Payer: 59 | Admitting: Nurse Practitioner

## 2023-03-22 DIAGNOSIS — I639 Cerebral infarction, unspecified: Secondary | ICD-10-CM

## 2023-03-22 DIAGNOSIS — R634 Abnormal weight loss: Secondary | ICD-10-CM

## 2023-03-22 DIAGNOSIS — I2510 Atherosclerotic heart disease of native coronary artery without angina pectoris: Secondary | ICD-10-CM

## 2023-03-22 DIAGNOSIS — Z515 Encounter for palliative care: Secondary | ICD-10-CM

## 2023-03-22 DIAGNOSIS — F0150 Vascular dementia without behavioral disturbance: Secondary | ICD-10-CM

## 2023-03-22 DIAGNOSIS — R630 Anorexia: Secondary | ICD-10-CM

## 2023-03-22 NOTE — Progress Notes (Signed)
Therapist, nutritional Palliative Care Consult Note Telephone: [phone]  Fax: [phone]    Date of encounter: 03/22/23 8:42 PM PATIENT NAME: Douglas Hale [address] [place] [address]   [phone] (home)  DOB: 01-Aug-1941 MRN: [id number] PRIMARY CARE PROVIDER:    [facility]  RESPONSIBLE PARTY:    Contact Information     Name Relation Home Work Mobile   Pillow Hale [phone]             [facility] Consult Note Telephone: [phone]  Fax: [phone]      Date of encounter: 12/29/22 1:38 PM PATIENT NAME: Douglas Hale [address] [place] [address]   [phone] (home)  DOB: 09-18-1941 MRN: [id number] PRIMARY CARE PROVIDER:    [facility]   RESPONSIBLE PARTY:    Contact Information       Name Relation Home Work Mobile    Douglas Hale,Douglas Hale [phone]           I met face to face with patient and family in facility. Palliative Care was asked to follow this patient by consultation request of  [facility] to address advance care planning and complex medical decision making. This is a follow up visit.                                  ASSESSMENT AND PLAN / RECOMMENDATIONS:  Symptom Management/Plan: 1. Advance Care Planning;  DNR  03/23/2023; I called Douglas, Douglas Hale, discussed clinical decline, more confusion, sleeping more >16 to 20 hrs day if allowed to by staff; functional decline, more difficulty getting oob, out of chair, tranfers requiring assistance; discussed weight loss; discussed option of hospice services with Douglas Hale wishes to move forward with hospice evaluation. Will notify facility to send order.   PPS 1 month ago 50% Currently low 40%   2. Palliative care encounter; Palliative care encounter; Palliative medicine team will continue to support patient, patient's family, and medical team. Visit consisted of counseling and education  dealing with the complex and emotionally intense issues of symptom management and palliative care in the setting of serious and potentially life-threatening illness   3. 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 Possible if continue to loose weight may benefit from looking at option of hospice for supportive care should loose >10% body weight, though no wounds, no other s/s, no recent infections, changes in functional abilities. Will f/u with explore option of hospice with Douglas Hale;. Attempted to contact. 06/16/2022 weight 166.5 lbs 09/25/2022 weight 152 lbs 1/12/18/2022 weight 156 lbs 12/18/2022 weight 154 lbs 03/18/2023 weight 143.9 lbs 22.6 lbs/8 months; 13.57% 12.1 lbs/4 months; 7.76% 10.1 lbs/3 months; 6.56% Follow up Palliative Care Visit: PC f/u visit further discussion monitor trends of appetite, weights, monitor for functional, cognitive decline with chronic disease progression, assess any active symptoms, supportive role.Palliative care will continue to follow for complex medical decision making, advance care planning, and clarification of goals. Return 2 to 8  weeks or prn.   I spent 45 minutes providing this consultation. More than 50% of the time in this consultation was spent in counseling and care coordination. PPS:    Chief Complaint: Follow up palliative consult for complex medical decision making, address goals, manage ongoing symptoms   HISTORY OF PRESENT ILLNESS:  Douglas Hale is  a 82 y.o. year old male  with multiple medical problems including Vascular Dementia, intraparenchymal hemorrhage of brain, HTN, cerebrovascular small vessel disease, CAD, hypothyroidism, BPH, HLD, h/o constipation, depression, anxiety. Douglas Hale resides LTC at [facility]. Douglas Hale does require assistance functionally with transfers with h/o multiple falls, requires assistance for bathing,  dressing. Douglas Hale does feed himself  appetite being fair to good depending on what is being served. No recent falls, wounds, hospitalizations, infections per staff. Purpose of today PC f/u visit further discussion monitor trends of appetite, weights, monitor for functional, cognitive decline with chronic disease progression, assess any active symptoms, supportive role. At present Douglas Hale is propelling with his feet his w/c in the hall. Douglas Hale was bright, saying words that did not make any sense. Douglas Hale was smiling, laughing, happy. Douglas Hale was cooperative with assessment.  Limited discussion with cognitive impairment. Support provided. Medications, medical goals, poc reviewed. Attempted to contact wife, Douglas Hale, message left with contact information to return call about pc visit. Updated staff, continue to monitor weights, encourage Douglas Hale to eat, nutrition, oob as able    History obtained from review of EMR, discussion with primary team, and interview with family, facility staff/caregiver and/or Douglas. Hale.  I reviewed available labs, medications, imaging, studies and related documents from the EMR.  Records reviewed and summarized above.  Physical Exam: General: interactive, pleasant male, oriented to self, confused ENMT: oral mucous membranes moist CV: S1S2, RRR Pulmonary: breath sounds clear Neuro:  + generalized weakness,  + cognitive impairment Psych: non-anxious affect, Alert    Thank you for the opportunity to participate in the care of Douglas. Zoss. Please call our office at [phone] if we can be of additional assistance.   Shiara Mcgough [place], NP

## 2024-02-15 DEATH — deceased
